# Patient Record
Sex: Female | Born: 1965 | Race: White | Hispanic: No | Marital: Married | Smoking: Former smoker
Health system: Southern US, Community
[De-identification: ages and names within clinical notes are randomized; demographics above are authoritative.]

## PROBLEM LIST (undated history)

## (undated) DIAGNOSIS — F32A Depression, unspecified: Secondary | ICD-10-CM

## (undated) DIAGNOSIS — M797 Fibromyalgia: Secondary | ICD-10-CM

## (undated) DIAGNOSIS — G629 Polyneuropathy, unspecified: Secondary | ICD-10-CM

## (undated) DIAGNOSIS — G500 Trigeminal neuralgia: Secondary | ICD-10-CM

## (undated) DIAGNOSIS — I10 Essential (primary) hypertension: Secondary | ICD-10-CM

## (undated) DIAGNOSIS — F329 Major depressive disorder, single episode, unspecified: Secondary | ICD-10-CM

## (undated) HISTORY — PX: CHOLECYSTECTOMY: SHX55

## (undated) HISTORY — PX: GANGLION CYST EXCISION: SHX1691

## (undated) HISTORY — PX: TUBAL LIGATION: SHX77

---

## 2007-09-21 ENCOUNTER — Ambulatory Visit: Payer: Self-pay | Admitting: Internal Medicine

## 2008-06-06 ENCOUNTER — Ambulatory Visit: Payer: Self-pay | Admitting: Family Medicine

## 2010-08-10 ENCOUNTER — Emergency Department: Payer: Self-pay | Admitting: Emergency Medicine

## 2011-01-19 ENCOUNTER — Ambulatory Visit: Payer: Self-pay | Admitting: Family Medicine

## 2012-01-20 ENCOUNTER — Ambulatory Visit: Payer: Self-pay | Admitting: Emergency Medicine

## 2012-03-27 ENCOUNTER — Ambulatory Visit: Payer: Self-pay | Admitting: General Practice

## 2012-05-22 ENCOUNTER — Ambulatory Visit: Payer: Self-pay | Admitting: General Practice

## 2013-05-27 ENCOUNTER — Ambulatory Visit: Payer: Self-pay | Admitting: Physician Assistant

## 2013-07-24 ENCOUNTER — Ambulatory Visit: Payer: Self-pay | Admitting: Family Medicine

## 2013-09-09 ENCOUNTER — Ambulatory Visit: Payer: Self-pay | Admitting: Physician Assistant

## 2013-09-10 ENCOUNTER — Emergency Department: Payer: Self-pay | Admitting: Emergency Medicine

## 2014-04-24 ENCOUNTER — Ambulatory Visit
Admit: 2014-04-24 | Disposition: A | Discharge status: Home / Self Care | Payer: Self-pay | Attending: Family Medicine | Admitting: Family Medicine

## 2014-06-25 ENCOUNTER — Ambulatory Visit
Admission: EM | Admit: 2014-06-25 | Discharge: 2014-06-25 | Disposition: A | Discharge status: Home / Self Care | Payer: BLUE CROSS/BLUE SHIELD | Attending: Family Medicine | Admitting: Family Medicine

## 2014-06-25 DIAGNOSIS — J029 Acute pharyngitis, unspecified: Secondary | ICD-10-CM | POA: Insufficient documentation

## 2014-06-25 HISTORY — DX: Trigeminal neuralgia: G50.0

## 2014-06-25 LAB — RAPID STREP SCREEN (MED CTR MEBANE ONLY): STREPTOCOCCUS, GROUP A SCREEN (DIRECT): NEGATIVE

## 2014-06-25 MED ORDER — FLUCONAZOLE 150 MG PO TABS: 150.0000 mg | ORAL_TABLET | Freq: Every day | ORAL | Status: DC

## 2014-06-25 MED ORDER — AMOXICILLIN 500 MG PO CAPS: 500.0000 mg | ORAL_CAPSULE | Freq: Three times a day (TID) | ORAL | Status: AC

## 2014-06-25 NOTE — ED Provider Notes (Signed)
Patient presents today with symptoms of sore throat, subjective fever, lymph node swelling, generalized body aches. Patient states that she has had the symptoms since yesterday. Patient denies any cough, rhinorrhea, headache.  Review systems negative except mentioned above. Vitals as per chart.   GENERAL: NAD HEENT: mild to moderate pharyngeal erythema, no exudate, no erythema of TMs, mild cervical LAD RESP: CTA B CARD: RRR NEURO: CN II-XII groslly intact   A/P: Pharyngitis-rapid strep test was negative throat culture sent to lab. We'll prescribe patient Amoxicillin given symptoms. Patient requests Diflucan in case of a yeast infection. Ibuprofen when necessary. Patient will call in [phone] hours to check on throat culture results. She is to seek medical attention if symptoms do worsen.   Jolene ProvostKirtida Anginette Espejo, MD 06/25/14 [phone]

## 2014-06-25 NOTE — ED Notes (Signed)
"  Symptoms began yesterday morning with sore throat, transitioned into generalized body aches in afternoon. Left sided neck pain, pain with swallowing."

## 2014-06-28 LAB — CULTURE, GROUP A STREP (THRC)

## 2014-09-30 ENCOUNTER — Other Ambulatory Visit: Payer: Self-pay | Admitting: Obstetrics and Gynecology

## 2014-09-30 DIAGNOSIS — Z1231 Encounter for screening mammogram for malignant neoplasm of breast: Secondary | ICD-10-CM

## 2015-06-09 ENCOUNTER — Encounter: Payer: Self-pay | Admitting: *Deleted

## 2015-06-09 ENCOUNTER — Ambulatory Visit
Admission: EM | Admit: 2015-06-09 | Discharge: 2015-06-09 | Disposition: A | Discharge status: Home / Self Care | Payer: BLUE CROSS/BLUE SHIELD | Attending: Family Medicine | Admitting: Family Medicine

## 2015-06-09 DIAGNOSIS — M545 Low back pain, unspecified: Secondary | ICD-10-CM

## 2015-06-09 DIAGNOSIS — R109 Unspecified abdominal pain: Secondary | ICD-10-CM

## 2015-06-09 HISTORY — DX: Major depressive disorder, single episode, unspecified: F32.9

## 2015-06-09 HISTORY — DX: Depression, unspecified: F32.A

## 2015-06-09 LAB — URINALYSIS COMPLETE WITH MICROSCOPIC (ARMC ONLY)
BACTERIA UA: NONE SEEN
Bilirubin Urine: NEGATIVE
Glucose, UA: NEGATIVE mg/dL
HGB URINE DIPSTICK: NEGATIVE
Ketones, ur: NEGATIVE mg/dL
Leukocytes, UA: NEGATIVE
Nitrite: NEGATIVE
Protein, ur: NEGATIVE mg/dL
Specific Gravity, Urine: 1.01 (ref 1.005–1.030)
WBC UA: NONE SEEN WBC/hpf (ref 0–5)
pH: 7 (ref 5.0–8.0)

## 2015-06-09 MED ORDER — CYCLOBENZAPRINE HCL 10 MG PO TABS: 10.0000 mg | ORAL_TABLET | Freq: Every day | ORAL | Status: DC

## 2015-06-09 NOTE — ED Notes (Signed)
Patient started having right flank pain one month ago that has come and gone. Last PM the right flank pain became worse. Patient has a history of gallbladder disease and cholecystectomy.

## 2015-06-09 NOTE — Discharge Instructions (Signed)

## 2015-08-05 NOTE — ED Provider Notes (Signed)
CSN: [id number]     Arrival date & time 06/09/15  0945 History   First MD Initiated Contact with Patient 06/09/15 1055     Chief Complaint  Patient presents with  . Flank Pain   (Consider location/radiation/quality/duration/timing/severity/associated sxs/prior Treatment) HPI Comments: 50 yo female with a c/o intermittent right flank pain for the past month but worse since last night. Denies any fevers, chills, dysuria, vomiting, diarrhea, melena, hematochezia.   Patient is a 50 y.o. female presenting with flank pain. The history is provided by the patient.  Flank Pain This is a new problem. The current episode started more than 1 week ago. Episode frequency: intermittent. Pertinent negatives include no chest pain and no shortness of breath.    Past Medical History  Diagnosis Date  . Trigeminal neuralgia   . Depression    Past Surgical History  Procedure Laterality Date  . Cholecystectomy    . Tubal ligation    . Ganglion cyst excision Right    History reviewed. No pertinent family history. Social History  Substance Use Topics  . Smoking status: Never Smoker   . Smokeless tobacco: None  . Alcohol Use: Yes   OB History    No data available     Review of Systems  Respiratory: Negative for shortness of breath.   Cardiovascular: Negative for chest pain.  Genitourinary: Positive for flank pain.    Allergies  Codeine sulfate  Home Medications   Prior to Admission medications   Medication Sig Start Date End Date Taking? Authorizing Provider  Norgestimate-Ethinyl Estradiol Triphasic (ORTHO TRI-CYCLEN LO) 0.18/0.215/0.25 MG-25 MCG tab Take 1 tablet by mouth daily.   Yes Historical Provider, MD  cyclobenzaprine (FLEXERIL) 10 MG tablet Take 1 tablet (10 mg total) by mouth at bedtime. 06/09/15   Payton Mccallum, MD  fluconazole (DIFLUCAN) 150 MG tablet Take 1 tablet (150 mg total) by mouth daily. 06/25/14   Jolene Provost, MD   Meds Ordered and Administered this Visit  Medications  - No data to display  BP 126/81 mmHg  Pulse 76  Temp(Src) 98.4 F (36.9 C) (Oral)  Resp 18  Ht  (1.651 m)  Wt 185 lb (83.915 kg)  BMI 30.79 kg/m2  SpO2 98%  LMP 05/26/2015 No data found.   Physical Exam  Constitutional: She appears well-developed.  Cardiovascular: Normal rate, regular rhythm, normal heart sounds and intact distal pulses.   No murmur heard. Pulmonary/Chest: Effort normal and breath sounds normal. No respiratory distress. She has no wheezes. She has no rales. She exhibits tenderness (right lower chest wall ribs).  Abdominal: Soft. Bowel sounds are normal. She exhibits no distension and no mass. There is no tenderness. There is no rebound and no guarding.  Neurological: She is alert.  Skin: Skin is warm and dry. No rash noted. She is not diaphoretic. No erythema.  Nursing note and vitals reviewed.   ED Course  Procedures (including critical care time)  Labs Review Labs Reviewed  URINALYSIS COMPLETEWITH MICROSCOPIC [facility] ONLY) - Abnormal; Notable for the following:    Squamous Epithelial / LPF 0-5 (*)    All other components within normal limits    Imaging Review No results found.   Visual Acuity Review  Right Eye Distance:   Left Eye Distance:   Bilateral Distance:    Right Eye Near:   Left Eye Near:    Bilateral Near:         MDM   1. Right-sided low back pain without sciatica  2. Right flank pain    Discharge Medication List as of 06/09/2015 11:57 AM     1. Lab results and diagnosis reviewed with patient 2. rx as per orders above; reviewed possible side effects, interactions, risks and benefits  3. Recommend supportive treatment with otc analgesics prn 4. Follow-up prn if symptoms worsen or don't improve    Payton Mccallumrlando Zykerria Tanton, MD 08/05/15 1909

## 2015-08-24 ENCOUNTER — Other Ambulatory Visit: Payer: Self-pay | Admitting: Family Medicine

## 2015-08-24 DIAGNOSIS — Z1231 Encounter for screening mammogram for malignant neoplasm of breast: Secondary | ICD-10-CM

## 2015-08-25 ENCOUNTER — Ambulatory Visit
Admission: RE | Admit: 2015-08-25 | Discharge: 2015-08-25 | Disposition: A | Discharge status: Home / Self Care | Payer: BLUE CROSS/BLUE SHIELD | Source: Ambulatory Visit | Attending: Family Medicine | Admitting: Family Medicine

## 2015-08-25 ENCOUNTER — Other Ambulatory Visit: Payer: Self-pay | Admitting: Family Medicine

## 2015-08-25 DIAGNOSIS — Z1231 Encounter for screening mammogram for malignant neoplasm of breast: Secondary | ICD-10-CM

## 2015-12-07 DIAGNOSIS — Z8669 Personal history of other diseases of the nervous system and sense organs: Secondary | ICD-10-CM | POA: Insufficient documentation

## 2016-02-15 ENCOUNTER — Ambulatory Visit
Admission: EM | Admit: 2016-02-15 | Discharge: 2016-02-15 | Disposition: A | Discharge status: Home / Self Care | Payer: BLUE CROSS/BLUE SHIELD | Attending: Family Medicine | Admitting: Family Medicine

## 2016-02-15 ENCOUNTER — Encounter: Payer: Self-pay | Admitting: Emergency Medicine

## 2016-02-15 DIAGNOSIS — J069 Acute upper respiratory infection, unspecified: Secondary | ICD-10-CM

## 2016-02-15 MED ORDER — BENZONATATE 200 MG PO CAPS: 200.0000 mg | ORAL_CAPSULE | Freq: Three times a day (TID) | ORAL | 0 refills | Status: DC

## 2016-02-15 MED ORDER — FEXOFENADINE-PSEUDOEPHED ER 60-120 MG PO TB12: 1.0000 | ORAL_TABLET | Freq: Two times a day (BID) | ORAL | 0 refills | Status: DC

## 2016-02-15 MED ORDER — PREDNISONE 20 MG PO TABS: ORAL_TABLET | ORAL | 0 refills | Status: DC

## 2016-02-15 NOTE — ED Provider Notes (Signed)
CSN: [id number]     Arrival date & time 02/15/16  1343 History   First MD Initiated Contact with Patient 02/15/16 1741     Chief Complaint  Patient presents with  . Generalized Body Aches  . Fever   (Consider location/radiation/quality/duration/timing/severity/associated sxs/prior Treatment) HPI  Is a 51 year old female who presents today for chills alternating with sweat nasal congestion sore throat and ear pain. She has had fever That has been more subjective. Over The counter medications have not benefited her. She denies any shortness of breath she's had no sick contacts did not have a flu shot this year. He also complained of wheezing at nighttime when she lays down.      Past Medical History:  Diagnosis Date  . Depression   . Trigeminal neuralgia    Past Surgical History:  Procedure Laterality Date  . CHOLECYSTECTOMY    . GANGLION CYST EXCISION Right   . TUBAL LIGATION     Family History  Problem Relation Age of Onset  . Breast cancer Maternal Aunt 59   Social History  Substance Use Topics  . Smoking status: Never Smoker  . Smokeless tobacco: Never Used  . Alcohol use Yes   OB History    No data available     Review of Systems  Constitutional: Positive for activity change, chills, fatigue and fever.  HENT: Positive for congestion, postnasal drip, sinus pain, sinus pressure and sore throat.   Respiratory: Positive for cough and wheezing. Negative for shortness of breath and stridor.   All other systems reviewed and are negative.   Allergies  Percocet [oxycodone-acetaminophen] and Codeine sulfate  Home Medications   Prior to Admission medications   Medication Sig Start Date End Date Taking? Authorizing Provider  benzonatate (TESSALON) 200 MG capsule Take 1 capsule (200 mg total) by mouth 3 (three) times daily. As necessary for coughing 02/15/16   Rachel Feil, PA-C  fexofenadine-pseudoephedrine (ALLEGRA-D) 60-120 MG 12 hr tablet Take 1 tablet by mouth  every 12 (twelve) hours. 02/15/16   Rachel Feil, PA-C  Norgestimate-Ethinyl Estradiol Triphasic (ORTHO TRI-CYCLEN LO) 0.18/0.215/0.25 MG-25 MCG tab Take 1 tablet by mouth daily.    Historical Provider, MD  predniSONE (DELTASONE) 20 MG tablet Take 2 tablets (40 mg) daily by mouth 02/15/16   Rachel Feil, PA-C   Meds Ordered and Administered this Visit  Medications - No data to display  BP 136/77 (BP Location: Left Arm)   Pulse 100   Temp 98.5 F (36.9 C) (Oral)   Resp 16   Ht 5\' 5"  (1.651 m)   Wt 190 lb (86.2 kg)   LMP 02/08/2016 (Approximate)   SpO2 99%   BMI 31.62 kg/m  No data found.   Physical Exam  Constitutional: She is oriented to person, place, and time. She appears well-developed and well-nourished. No distress.  HENT:  Head: Normocephalic and atraumatic.  Right Ear: External ear normal.  Left Ear: External ear normal.  Nose: Nose normal.  Mouth/Throat: Oropharynx is clear and moist. No oropharyngeal exudate.  The patient's turbinates are swollen erythematous  Eyes: EOM are normal. Pupils are equal, round, and reactive to light. Right eye exhibits no discharge. Left eye exhibits no discharge.  Neck: Normal range of motion. Neck supple.  Pulmonary/Chest: Effort normal and breath sounds normal. No respiratory distress. She has no wheezes. She has no rales.  Musculoskeletal: Normal range of motion.  Lymphadenopathy:    She has no cervical adenopathy.  Neurological: She is alert and  oriented to person, place, and time.  Skin: Skin is warm and dry. She is not diaphoretic.  Psychiatric: She has a normal mood and affect. Her behavior is normal. Judgment and thought content normal.  Nursing note and vitals reviewed.   Urgent Care Course     Procedures (including critical care time)  Labs Review Labs Reviewed - No data to display  Imaging Review No results found.   Visual Acuity Review  Right Eye Distance:   Left Eye Distance:   Bilateral Distance:     Right Eye Near:   Left Eye Near:    Bilateral Near:         MDM   1. Upper respiratory tract infection, unspecified type    Discharge Medication List as of 02/15/2016  6:10 PM    START taking these medications   Details  benzonatate (TESSALON) 200 MG capsule Take 1 capsule (200 mg total) by mouth 3 (three) times daily. As necessary for coughing, Starting Mon 02/15/2016, Normal    fexofenadine-pseudoephedrine (ALLEGRA-D) 60-120 MG 12 hr tablet Take 1 tablet by mouth every 12 (twelve) hours., Starting Mon 02/15/2016, Normal    predniSONE (DELTASONE) 20 MG tablet Take 2 tablets (40 mg) daily by mouth, Print      Plan: 1. Test/x-ray results and diagnosis reviewed with patient 2. rx as per orders; risks, benefits, potential side effects reviewed with patient 3. Recommend supportive treatment with Rest and fluids. Tylenol and Motrin for aches pains and fever. Continue using Nasacort for swollen nasal passages and to help promote drainage. Using prednisone for persistent coughing do not take if the coughing stops. If not Progressing follow-up with her primary care physician 4. F/u prn if symptoms worsen or don't improve     Rachel FeilWilliam P Dorella Laster, PA-C 02/15/16 1820

## 2016-02-15 NOTE — ED Triage Notes (Signed)
Patient c/o cough, runny nose, bodyaches and fever since Thursday.

## 2016-03-25 ENCOUNTER — Other Ambulatory Visit: Payer: Self-pay

## 2016-06-01 ENCOUNTER — Other Ambulatory Visit: Payer: Self-pay | Admitting: Neurology

## 2016-06-01 DIAGNOSIS — R41840 Attention and concentration deficit: Secondary | ICD-10-CM

## 2016-06-01 DIAGNOSIS — R569 Unspecified convulsions: Secondary | ICD-10-CM

## 2016-06-21 ENCOUNTER — Ambulatory Visit: Payer: BLUE CROSS/BLUE SHIELD

## 2016-07-07 ENCOUNTER — Emergency Department: Payer: BLUE CROSS/BLUE SHIELD

## 2016-07-07 ENCOUNTER — Emergency Department
Admission: EM | Admit: 2016-07-07 | Discharge: 2016-07-07 | Disposition: A | Discharge status: Home / Self Care | Payer: BLUE CROSS/BLUE SHIELD | Attending: Emergency Medicine | Admitting: Emergency Medicine

## 2016-07-07 DIAGNOSIS — R519 Headache, unspecified: Secondary | ICD-10-CM

## 2016-07-07 DIAGNOSIS — Z8669 Personal history of other diseases of the nervous system and sense organs: Secondary | ICD-10-CM | POA: Diagnosis not present

## 2016-07-07 DIAGNOSIS — R200 Anesthesia of skin: Secondary | ICD-10-CM | POA: Diagnosis not present

## 2016-07-07 DIAGNOSIS — I639 Cerebral infarction, unspecified: Secondary | ICD-10-CM | POA: Insufficient documentation

## 2016-07-07 DIAGNOSIS — Z79899 Other long term (current) drug therapy: Secondary | ICD-10-CM | POA: Insufficient documentation

## 2016-07-07 DIAGNOSIS — R51 Headache: Secondary | ICD-10-CM | POA: Diagnosis not present

## 2016-07-07 LAB — DIFFERENTIAL
BASOS ABS: 0 10*3/uL (ref 0–0.1)
Basophils Relative: 1 %
EOS ABS: 0 10*3/uL (ref 0–0.7)
EOS PCT: 1 %
LYMPHS ABS: 1.8 10*3/uL (ref 1.0–3.6)
Lymphocytes Relative: 31 %
MONOS PCT: 5 %
Monocytes Absolute: 0.3 10*3/uL (ref 0.2–0.9)
Neutro Abs: 3.8 10*3/uL (ref 1.4–6.5)
Neutrophils Relative %: 64 %

## 2016-07-07 LAB — COMPREHENSIVE METABOLIC PANEL
ALT: 26 U/L (ref 14–54)
AST: 27 U/L (ref 15–41)
Albumin: 3.9 g/dL (ref 3.5–5.0)
Alkaline Phosphatase: 49 U/L (ref 38–126)
Anion gap: 6 (ref 5–15)
BILIRUBIN TOTAL: 0.7 mg/dL (ref 0.3–1.2)
BUN: 13 mg/dL (ref 6–20)
CO2: 25 mmol/L (ref 22–32)
Calcium: 9.2 mg/dL (ref 8.9–10.3)
Chloride: 106 mmol/L (ref 101–111)
Creatinine, Ser: 0.75 mg/dL (ref 0.44–1.00)
GFR calc Af Amer: >60 mL/min (ref >60)
GFR calc non Af Amer: >60 mL/min (ref >60)
Glucose, Bld: 90 mg/dL (ref 65–99)
POTASSIUM: 4.1 mmol/L (ref 3.5–5.1)
Sodium: 137 mmol/L (ref 135–145)
Total Protein: 7.5 g/dL (ref 6.5–8.1)

## 2016-07-07 LAB — CBC
HEMATOCRIT: 36.9 % (ref 35.0–47.0)
HEMOGLOBIN: 12.4 g/dL (ref 12.0–16.0)
MCH: 29.1 pg (ref 26.0–34.0)
MCHC: 33.6 g/dL (ref 32.0–36.0)
MCV: 86.5 fL (ref 80.0–100.0)
Platelets: 253 10*3/uL (ref 150–440)
RBC: 4.27 MIL/uL (ref 3.80–5.20)
RDW: 13.2 % (ref 11.5–14.5)
WBC: 5.9 10*3/uL (ref 3.6–11.0)

## 2016-07-07 LAB — PROTIME-INR
INR: 1.07
Prothrombin Time: 13.9 seconds (ref 11.4–15.2)

## 2016-07-07 LAB — GLUCOSE, CAPILLARY: Glucose-Capillary: 94 mg/dL (ref 65–99)

## 2016-07-07 LAB — TROPONIN I

## 2016-07-07 LAB — APTT: aPTT: 26 seconds (ref 24–36)

## 2016-07-07 MED ORDER — KETOROLAC TROMETHAMINE 30 MG/ML IJ SOLN
30.0000 mg | Freq: Once | INTRAMUSCULAR | Status: AC
  Administered 2016-07-07: 30 mg via INTRAVENOUS
  Filled 2016-07-07: qty 1

## 2016-07-07 MED ORDER — PROCHLORPERAZINE EDISYLATE 5 MG/ML IJ SOLN
10.0000 mg | Freq: Once | INTRAMUSCULAR | Status: AC
  Administered 2016-07-07: 10 mg via INTRAVENOUS
  Filled 2016-07-07: qty 2

## 2016-07-07 MED ORDER — SODIUM CHLORIDE 0.9 % IV BOLUS (SEPSIS)
1000.0000 mL | Freq: Once | INTRAVENOUS | Status: AC
  Administered 2016-07-07: 1000 mL via INTRAVENOUS

## 2016-07-07 MED ORDER — DIPHENHYDRAMINE HCL 50 MG/ML IJ SOLN
25.0000 mg | Freq: Once | INTRAMUSCULAR | Status: AC
  Administered 2016-07-07: 25 mg via INTRAVENOUS
  Filled 2016-07-07: qty 1

## 2016-07-07 NOTE — ED Notes (Signed)
SOC  REPORT  GIVEN  TO DR  Pershing ProudSCHAEVITZ

## 2016-07-07 NOTE — ED Notes (Signed)
SOC    CALLED 

## 2016-07-07 NOTE — ED Notes (Signed)
cbg 94 

## 2016-07-07 NOTE — ED Notes (Signed)
SOC on at bedside, neurologist assessing pt at this time

## 2016-07-07 NOTE — ED Provider Notes (Signed)
[facility] Emergency Department Provider Note  ____________________________________________   First MD Initiated Contact with Patient 07/07/16 1527     (approximate)  I have reviewed the triage vital signs and the nursing notes.   HISTORY  Chief Complaint Code Stroke and Facial numbness   HPI Rachel Pratt is a 51 y.o. female with a history of trigeminal neuralgia as well as depression was presenting to the emergency department with right-sided facial numbness over the past hour. She says that she also thought that she noticed a right-sided facial droop especially around her right lateral eye. Patient says that she now is a 4-5 out of 10 headache. Said that her headache was an 8 out of 10 this morning also a 5 out of 10 before going to sleep last night. Says the headache is diffuse. Denies any vision change. Denies any nausea or vomiting. Denies any history of migraine accompanied a migraine. History of cardiac disease in her family and TIAs versus seizures and her father. Patient is not on any chronic medications. Does not smoke cigarettes or drink alcohol. Says that she uses marijuana occasionally. Says that she has also had some numbness to the right side of her chest and her right arm she is not experiencing either of these symptoms at this time.   Past Medical History:  Diagnosis Date  . Depression   . Trigeminal neuralgia     There are no active problems to display for this patient.   Past Surgical History:  Procedure Laterality Date  . CHOLECYSTECTOMY    . GANGLION CYST EXCISION Right   . TUBAL LIGATION      Prior to Admission medications   Medication Sig Start Date End Date Taking? Authorizing Provider  modafinil (PROVIGIL) 100 MG tablet Take 100 mg by mouth every morning. 07/02/16  Yes [provider]  TRI-PREVIFEM 0.18/0.215/0.25 MG-35 MCG tablet Take 1 tablet by mouth daily. 05/22/16  Yes [provider]  benzonatate  (TESSALON) 200 MG capsule Take 1 capsule (200 mg total) by mouth 3 (three) times daily. As necessary for coughing Patient not taking: Reported on 07/07/2016 02/15/16   Ovid Curd P, PA-C  fexofenadine-pseudoephedrine (ALLEGRA-D) 60-120 MG 12 hr tablet Take 1 tablet by mouth every 12 (twelve) hours. Patient not taking: Reported on 07/07/2016 02/15/16   Lutricia Feil, PA-C  predniSONE (DELTASONE) 20 MG tablet Take 2 tablets (40 mg) daily by mouth Patient not taking: Reported on 07/07/2016 02/15/16   Lutricia Feil, PA-C    Allergies Percocet [oxycodone-acetaminophen] and Codeine sulfate  Family History  Problem Relation Age of Onset  . Breast cancer Maternal Aunt 59    Social History Social History  Substance Use Topics  . Smoking status: Never Smoker  . Smokeless tobacco: Never Used  . Alcohol use Yes    Review of Systems  Constitutional: No fever/chills Eyes: No visual changes. ENT: No sore throat. Cardiovascular: Denies chest pain. Respiratory: Denies shortness of breath. Gastrointestinal: No abdominal pain.  No nausea, no vomiting.  No diarrhea.  No constipation. Genitourinary: Negative for dysuria. Musculoskeletal: Negative for back pain. Skin: Negative for rash. Neurological: Negative for focal weakness    ____________________________________________   PHYSICAL EXAM:  VITAL SIGNS: ED Triage Vitals  Enc Vitals Group     BP 07/07/16 1519 (!) 160/100     Pulse Rate 07/07/16 1519 77     Resp 07/07/16 1519 18     Temp 07/07/16 1519 98.5 F (36.9 C)  Temp Source 07/07/16 1519 Oral     SpO2 07/07/16 1519 97 %     Weight 07/07/16 1519 190 lb (86.2 kg)     Height 07/07/16 1519 5\' 5"  (1.651 m)     Head Circumference --      Peak Flow --      Pain Score 07/07/16 1537 4     Pain Loc --      Pain Edu? --      Excl. in GC? --     Constitutional: Alert and oriented. Well appearing and in no acute distress. Eyes: Conjunctivae are normal.  Head:  Atraumatic. Nose: No congestion/rhinnorhea. Mouth/Throat: Mucous membranes are moist.  Neck: No stridor.   Cardiovascular: Normal rate, regular rhythm. Grossly normal heart sounds.  Respiratory: Normal respiratory effort.  No retractions. Lungs CTAB. Gastrointestinal: Soft and nontender. No distention. No CVA tenderness. Musculoskeletal: No lower extremity tenderness nor edema.  No joint effusions. Neurologic:  Normal speech and language. 5 out of 5 strength throughout without any obvious facial droop. Very slight decrease in sensation to light touch to the right side of the face. Skin:  Skin is warm, dry and intact. No rash noted. Psychiatric: Mood and affect are normal. Speech and behavior are normal.  NIH Stroke Scale  Person Administering Scale: Arelia Longest  Administer stroke scale items in the order listed. Record performance in each category after each subscale exam. Do not go back and change scores. Follow directions provided for each exam technique. Scores should reflect what the patient does, not what the clinician thinks the patient can do. The clinician should record answers while administering the exam and work quickly. Except where indicated, the patient should not be coached (i.e., repeated requests to patient to make a special effort).   1a  Level of consciousness: 0=alert; keenly responsive  1b. LOC questions:  0=Performs both tasks correctly  1c. LOC commands: 0=Performs both tasks correctly  2.  Best Gaze: 0=normal  3.  Visual: 0=No visual loss  4. Facial Palsy: 0=Normal symmetric movement  5a.  Motor left arm: 0=No drift, limb holds 90 (or 45) degrees for full 10 seconds  5b.  Motor right arm: 0=No drift, limb holds 90 (or 45) degrees for full 10 seconds  6a. motor left leg: 0=No drift, limb holds 90 (or 45) degrees for full 10 seconds  6b  Motor right leg:  0=No drift, limb holds 90 (or 45) degrees for full 10 seconds  7. Limb Ataxia: 0=Absent  8.  Sensory:  1=Mild to moderate sensory loss; patient feels pinprick is less sharp or is dull on the affected side; there is a loss of superficial pain with pinprick but patient is aware She is being touched  9. Best Language:  0=No aphasia, normal  10. Dysarthria: 0=Normal  11. Extinction and Inattention: 0=No abnormality  12. Distal motor function: 0=Normal   Total:   1   ____________________________________________   LABS (all labs ordered are listed, but only abnormal results are displayed)  Labs Reviewed  PROTIME-INR  APTT  CBC  DIFFERENTIAL  COMPREHENSIVE METABOLIC PANEL  TROPONIN I  GLUCOSE, CAPILLARY  CBG MONITORING, ED   ____________________________________________  EKG  ED ECG REPORT I, Schaevitz,  Teena Irani, the attending physician, personally viewed and interpreted this ECG.   Date: 07/07/2016  EKG Time: 1520  Rate: 77  Rhythm: normal sinus rhythm  Axis: normal  Intervals:none  ST&T Change: No ST segment elevation or depression. No abnormal T-wave inversion.  ____________________________________________  RADIOLOGY  No acute finding on the CT of the head.  MRI of the brain without any acute finding. ____________________________________________   PROCEDURES  Procedure(s) performed:   Procedures  Critical Care performed:   ____________________________________________   INITIAL IMPRESSION / ASSESSMENT AND PLAN / ED COURSE  Pertinent labs & imaging results that were available during my care of the patient were reviewed by me and considered in my medical decision making (see chart for details).  ----------------------------------------- 4:47 PM on 07/07/2016 -----------------------------------------  Patient initially made a stroke alert and was evaluating by the specialist on-call neurologist, Dr.Mrelashvili.  The neurologist does not recommend TPA at this time. Stroke like symptoms versus, migraine. We will treat with a "migraine cocktail" and reassess.  Patient also to receive an MRI.    ----------------------------------------- 6:02 PM on 07/07/2016 -----------------------------------------  Patient with very reassuring workup. Symptoms have completely resolved at this time. More likely, located migraine. Husband now bedside and had a concern about a possible melanoma in the patient's right iris. A 1 mm, oval shaped brown spot is noted. Patient says that she has a follow-up appointment within ophthalmologic oncologist. Very unlikely that this lesion would be the cause of her symptoms today. Patient will follow up with her primary care doctor as well as neurology regarding her current visit. We reviewed the imaging as well as lab results. She is understanding of the diagnosis will plan and is willing to comply.  ____________________________________________   FINAL CLINICAL IMPRESSION(S) / ED DIAGNOSES  Headache. Numbness.    NEW MEDICATIONS STARTED DURING THIS VISIT:  New Prescriptions   No medications on file     Note:  This document was prepared using Dragon voice recognition software and may include unintentional dictation errors.     Myrna BlazerSchaevitz, David Matthew, MD 07/07/16 [phone]

## 2016-07-07 NOTE — ED Notes (Signed)
CODE STROKE CALLED TO [phone] 

## 2016-07-07 NOTE — ED Triage Notes (Signed)
Pt reports one hour ago the right side of her face felt numb and she felt like the bone in her face was going sideways. Pt reports feeling like her right eye looks drooped and feels a tugging sensation. Bilateral strong hand grips, facial symmetry and no arm drift noted in triage. Spoke with Dr. Pershing ProudSchaevitz, code stroke orders received.

## 2016-07-07 NOTE — ED Notes (Signed)
Pt states aprox 1hr prior to arrival pt had some numbness to RT side of face and " behind her eye". Pt states went to bed with headache and continues, rates pain 4 at this time.

## 2016-07-07 NOTE — ED Notes (Signed)
ED Provider at bedside. 

## 2016-07-07 NOTE — ED Notes (Signed)
Patient transported to MRI 

## 2016-07-07 NOTE — ED Notes (Signed)
Pt denies any numbness or tingling to RT side eye, states all symptoms have resolved at this time, MD made aware

## 2016-07-08 ENCOUNTER — Ambulatory Visit: Payer: Self-pay | Admitting: Psychiatry

## 2016-07-13 ENCOUNTER — Ambulatory Visit (INDEPENDENT_AMBULATORY_CARE_PROVIDER_SITE_OTHER): Payer: BLUE CROSS/BLUE SHIELD | Admitting: Psychiatry

## 2016-07-13 DIAGNOSIS — F900 Attention-deficit hyperactivity disorder, predominantly inattentive type: Secondary | ICD-10-CM

## 2016-07-13 DIAGNOSIS — F330 Major depressive disorder, recurrent, mild: Secondary | ICD-10-CM

## 2016-07-13 MED ORDER — AMPHETAMINE-DEXTROAMPHETAMINE 5 MG PO TABS: ORAL_TABLET | ORAL | 0 refills | Status: DC
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
## 2016-08-11 ENCOUNTER — Ambulatory Visit: Payer: BLUE CROSS/BLUE SHIELD | Admitting: Psychiatry

## 2016-12-14 DIAGNOSIS — E66811 Obesity, class 1: Secondary | ICD-10-CM | POA: Insufficient documentation

## 2017-02-01 ENCOUNTER — Other Ambulatory Visit: Payer: Self-pay | Admitting: Family Medicine

## 2017-02-01 DIAGNOSIS — Z1231 Encounter for screening mammogram for malignant neoplasm of breast: Secondary | ICD-10-CM

## 2017-02-02 ENCOUNTER — Encounter (INDEPENDENT_AMBULATORY_CARE_PROVIDER_SITE_OTHER): Payer: Self-pay

## 2017-02-02 ENCOUNTER — Ambulatory Visit
Admission: RE | Admit: 2017-02-02 | Discharge: 2017-02-02 | Disposition: A | Discharge status: Home / Self Care | Payer: 59 | Source: Ambulatory Visit | Attending: Family Medicine | Admitting: Family Medicine

## 2017-02-02 DIAGNOSIS — R928 Other abnormal and inconclusive findings on diagnostic imaging of breast: Secondary | ICD-10-CM | POA: Diagnosis not present

## 2017-02-02 DIAGNOSIS — Z1231 Encounter for screening mammogram for malignant neoplasm of breast: Secondary | ICD-10-CM | POA: Diagnosis present

## 2017-02-02 DIAGNOSIS — N6310 Unspecified lump in the right breast, unspecified quadrant: Secondary | ICD-10-CM | POA: Insufficient documentation

## 2017-02-06 ENCOUNTER — Other Ambulatory Visit: Payer: Self-pay | Admitting: Family Medicine

## 2017-02-06 DIAGNOSIS — N6310 Unspecified lump in the right breast, unspecified quadrant: Secondary | ICD-10-CM

## 2017-02-06 DIAGNOSIS — R928 Other abnormal and inconclusive findings on diagnostic imaging of breast: Secondary | ICD-10-CM

## 2017-02-08 ENCOUNTER — Ambulatory Visit
Admission: RE | Admit: 2017-02-08 | Discharge: 2017-02-08 | Disposition: A | Discharge status: Home / Self Care | Payer: 59 | Source: Ambulatory Visit | Attending: Family Medicine | Admitting: Family Medicine

## 2017-02-08 DIAGNOSIS — N6001 Solitary cyst of right breast: Secondary | ICD-10-CM | POA: Diagnosis not present

## 2017-02-08 DIAGNOSIS — R928 Other abnormal and inconclusive findings on diagnostic imaging of breast: Secondary | ICD-10-CM

## 2017-02-08 DIAGNOSIS — N6310 Unspecified lump in the right breast, unspecified quadrant: Secondary | ICD-10-CM

## 2017-02-09 ENCOUNTER — Other Ambulatory Visit: Payer: Self-pay | Admitting: Family Medicine

## 2017-02-09 DIAGNOSIS — N6001 Solitary cyst of right breast: Secondary | ICD-10-CM

## 2017-08-10 ENCOUNTER — Ambulatory Visit
Admission: RE | Admit: 2017-08-10 | Discharge: 2017-08-10 | Disposition: A | Discharge status: Home / Self Care | Payer: 59 | Source: Ambulatory Visit | Attending: Family Medicine | Admitting: Family Medicine

## 2017-08-10 DIAGNOSIS — N6001 Solitary cyst of right breast: Secondary | ICD-10-CM | POA: Diagnosis present

## 2018-06-19 IMAGING — MG MM DIGITAL SCREENING BILAT W/ TOMO W/ CAD
8 of 13 series · 8 of 29 positions shown · non-contrast
Comparison: Previous exam(s).

CLINICAL DATA: Screening.

EXAM:
2D DIGITAL SCREENING BILATERAL MAMMOGRAM WITH CAD AND ADJUNCT TOMO

[L MLO (1 of 2)]
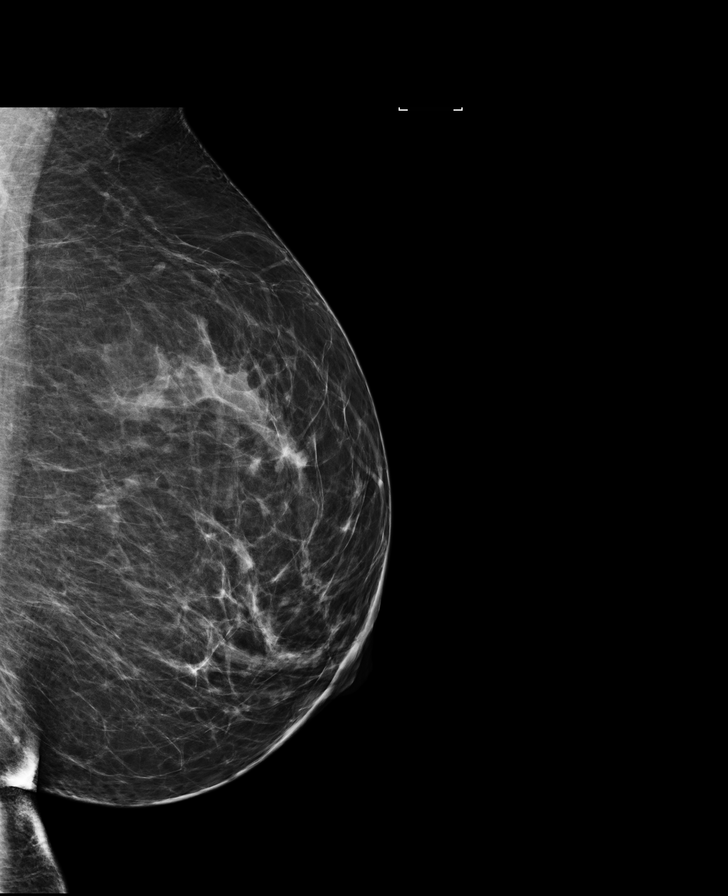

[L MLO (2 of 2)]
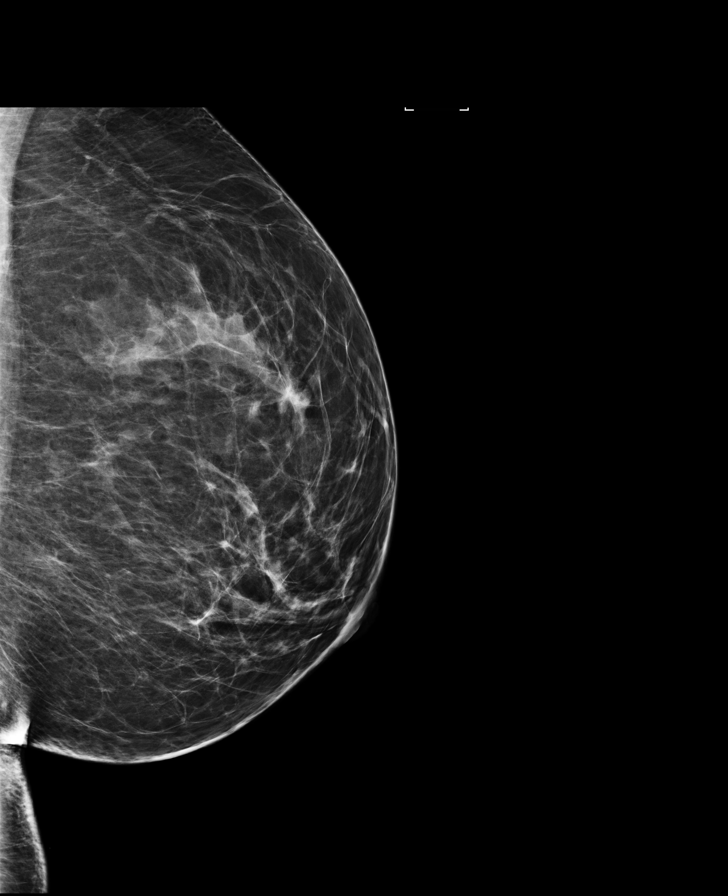

[R CC]
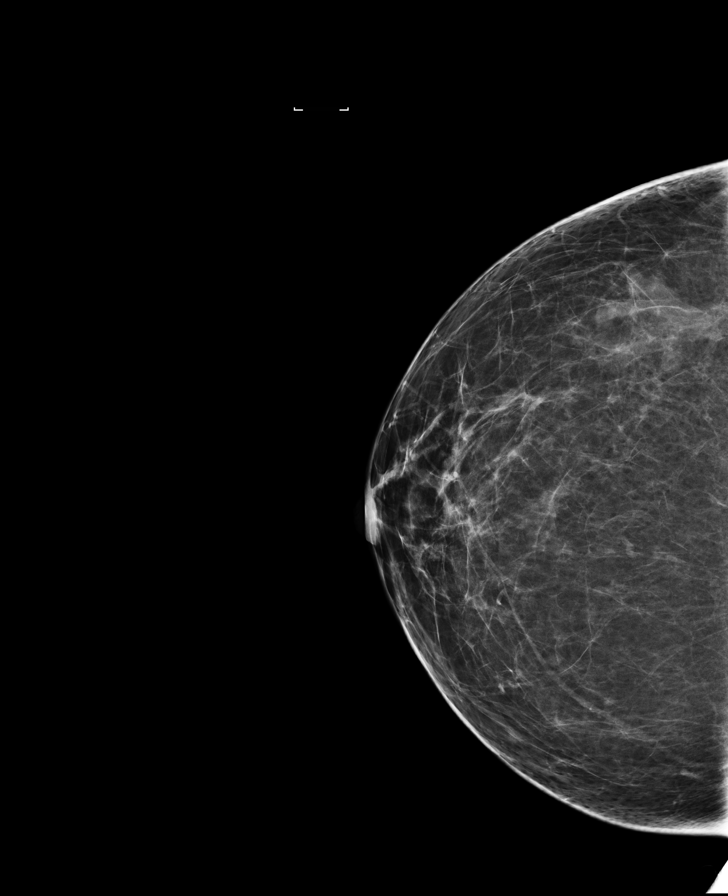

[L CC synth-2D]
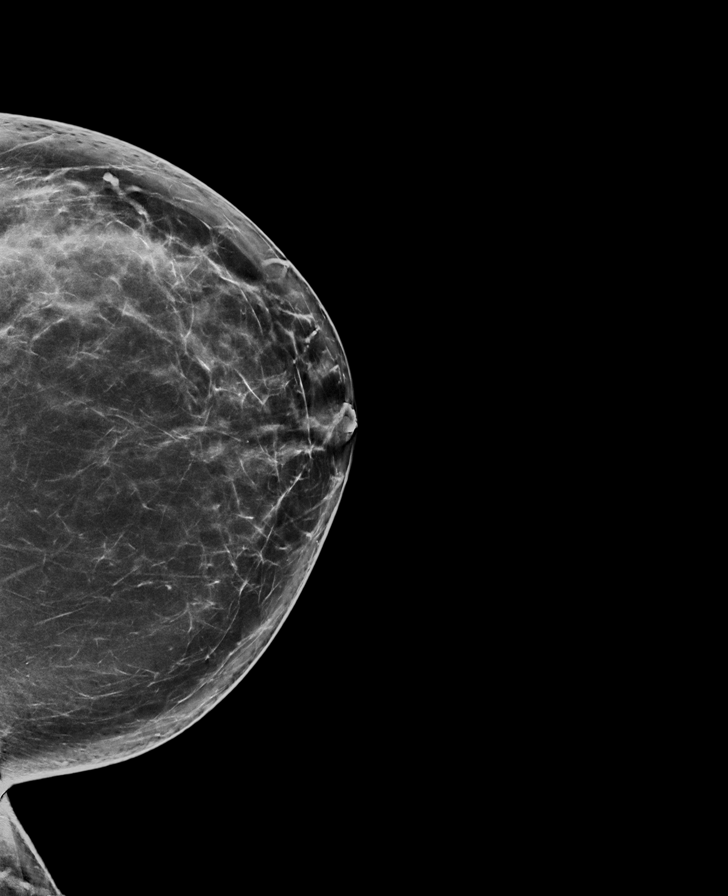

[L CC]
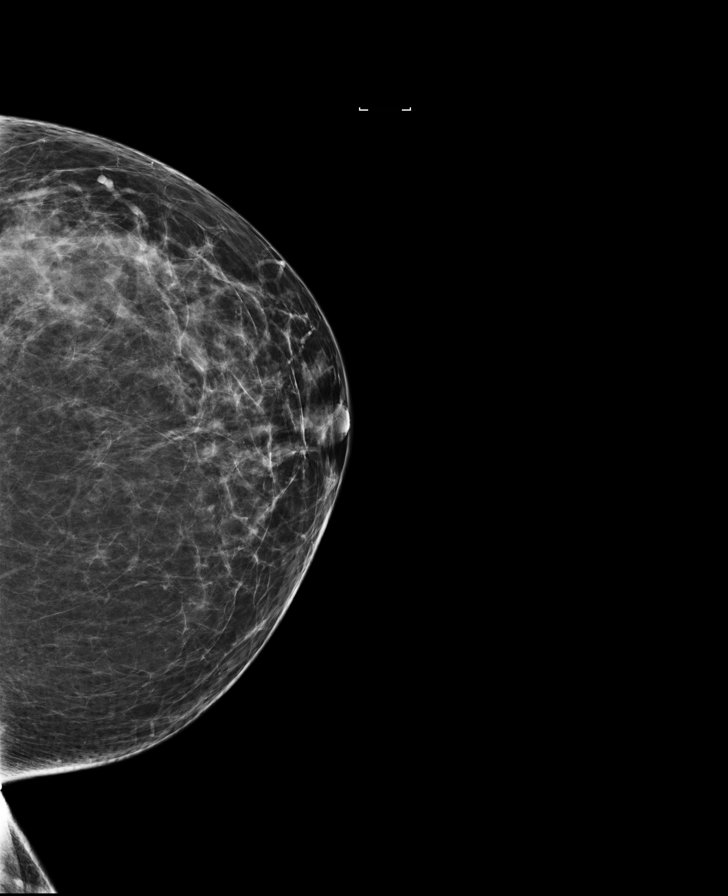

[R MLO synth-2D]
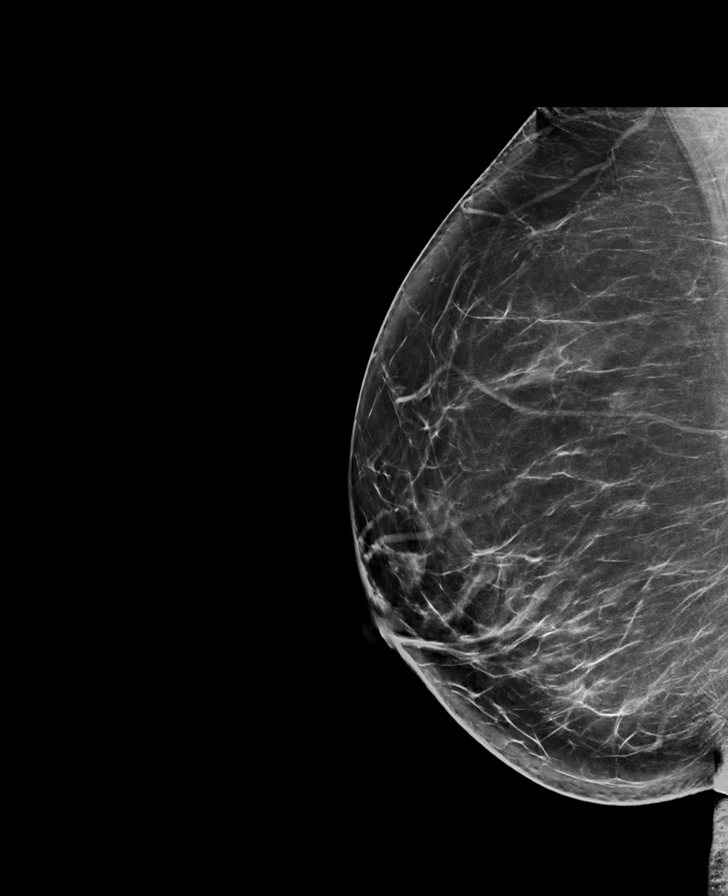

[L MLO synth-2D]
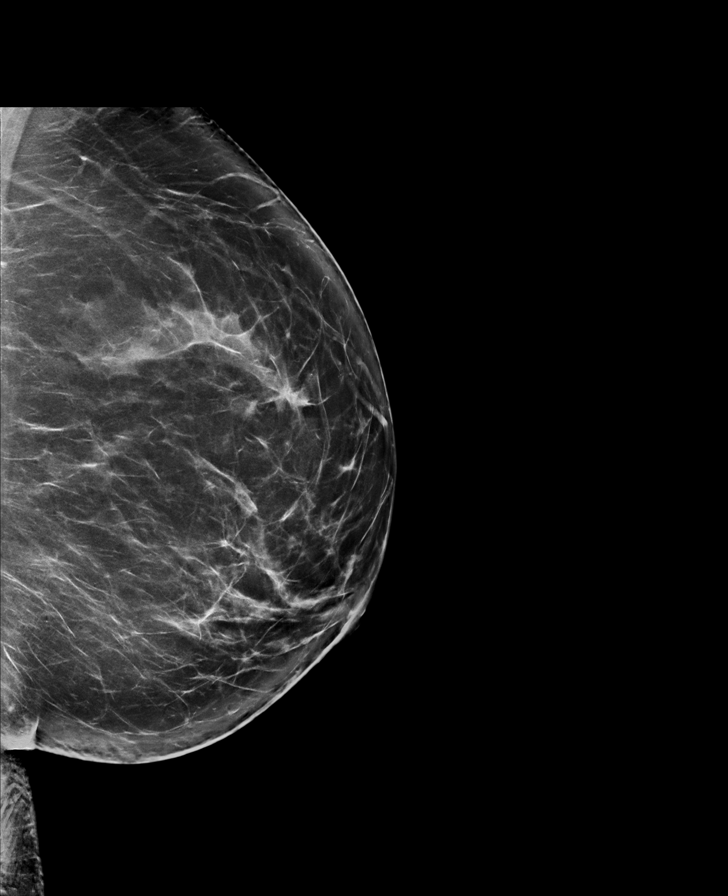

[R MLO]
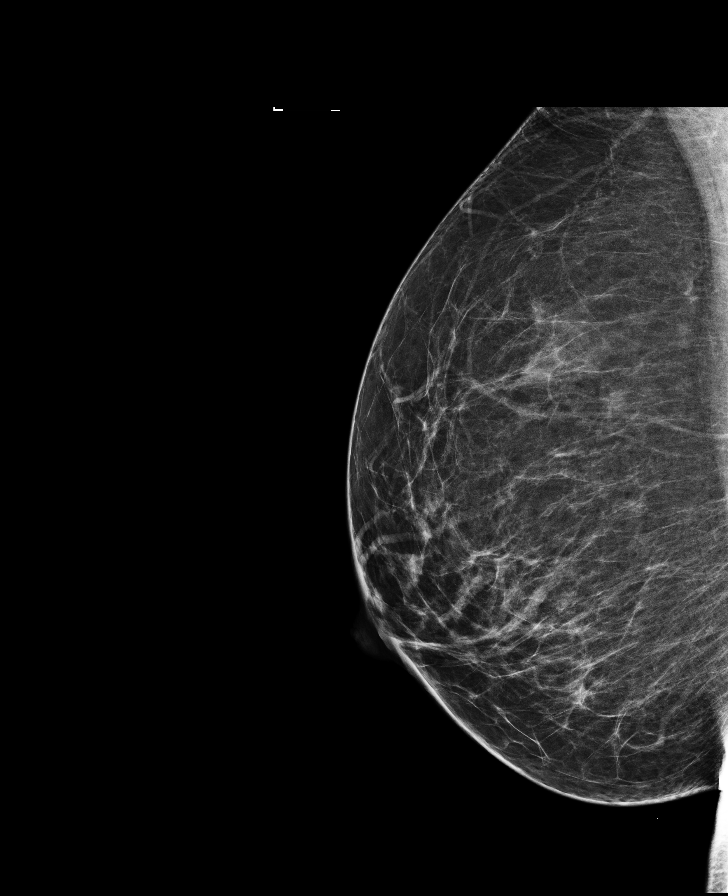

[8 of 29 positions shown; findings below may reference images not displayed]

ACR Breast Density Category b: There are scattered areas of
fibroglandular density.
FINDINGS: There are no findings suspicious for malignancy. Images were
processed with CAD.
IMPRESSION: No mammographic evidence of malignancy. A result letter of this
screening mammogram will be mailed directly to the patient.

RECOMMENDATION:
Screening mammogram in one year. (Code:97-6-RS4)

BI-RADS CATEGORY  1: Negative.

## 2018-09-26 ENCOUNTER — Other Ambulatory Visit: Payer: Self-pay

## 2018-09-26 DIAGNOSIS — Z20822 Contact with and (suspected) exposure to covid-19: Secondary | ICD-10-CM

## 2018-09-27 LAB — NOVEL CORONAVIRUS, NAA: SARS-CoV-2, NAA: NOT DETECTED

## 2018-09-28 ENCOUNTER — Encounter: Payer: Self-pay | Admitting: Nurse Practitioner

## 2018-09-28 ENCOUNTER — Telehealth: Payer: 59 | Admitting: Nurse Practitioner

## 2018-09-28 DIAGNOSIS — J069 Acute upper respiratory infection, unspecified: Secondary | ICD-10-CM

## 2018-09-28 MED ORDER — AZITHROMYCIN 250 MG PO TABS
ORAL_TABLET | ORAL | 0 refills | Status: AC
Start: 2018-09-28 — End: 2018-10-03

## 2018-09-28 MED ORDER — FLUTICASONE PROPIONATE 50 MCG/ACT NA SUSP: 2.0000 | Freq: Every day | NASAL | 0 refills | Status: DC

## 2018-09-28 NOTE — Progress Notes (Signed)
We are sorry you are not feeling well.  Here is how we plan to help!  Based on what you have shared with me, it looks like you may have a viral upper respiratory infection.  Upper respiratory infections are caused by a large number of viruses; however, rhinovirus is the most common cause.   Symptoms vary from person to person, with common symptoms including sore throat, cough, fatigue or lack of energy and feeling of general discomfort.  A low-grade fever of up to 100.4 may present, but is often uncommon.  Symptoms vary however, and are closely related to a person's age or underlying illnesses.  The most common symptoms associated with an upper respiratory infection are nasal discharge or congestion, cough, sneezing, headache and pressure in the ears and face.  These symptoms usually persist for about 3 to 10 days, but can last up to 2 weeks.  It is important to know that upper respiratory infections do not cause serious illness or complications in most cases.    Upper respiratory infections can be transmitted from person to person, with the most common method of transmission being a person's hands.  The virus is able to live on the skin and can infect other persons for up to 2 hours after direct contact.  Also, these can be transmitted when someone coughs or sneezes; thus, it is important to cover the mouth to reduce this risk.  To keep the spread of the illness at [place], good hand hygiene is very important.  This is an infection that is most likely caused by a virus.Because you have had a persistent fever over the past several days, I am going to prescribe a z-pak.  You will take 2 tablets today, then one tablet for the next 4 days.  We are sorry you are not feeling well.  Here is how we plan to help!   For nasal congestion, you may use an oral decongestants such as Mucinex D or if you have glaucoma or high blood pressure use plain Mucinex.  Saline nasal spray or nasal drops can help and can safely be  used as often as needed for congestion.  For your congestion, I have prescribed Fluticasone nasal spray one spray in each nostril twice a day.  If you do not have a history of heart disease, hypertension, diabetes or thyroid disease, prostate/bladder issues or glaucoma, you may also use Sudafed to treat nasal congestion.  It is highly recommended that you consult with a pharmacist or your primary care physician to ensure this medication is safe for you to take.     If you have a cough, you may use cough suppressants such as Delsym and Robitussin.  If you have glaucoma or high blood pressure, you can also use Coricidin HBP.   If you have a sore or scratchy throat, use a saltwater gargle-  to  teaspoon of salt dissolved in a 4-ounce to 8-ounce glass of warm water.  Gargle the solution for approximately 15-30 seconds and then spit.  It is important not to swallow the solution.  You can also use throat lozenges/cough drops and Chloraseptic spray to help with throat pain or discomfort.  Warm or cold liquids can also be helpful in relieving throat pain.  For headache, pain or general discomfort, you can use Ibuprofen or Tylenol as directed.   Some authorities believe that zinc sprays or the use of Echinacea may shorten the course of your symptoms.   HOME CARE . Only take  medications as instructed by your medical team. . Be sure to drink plenty of fluids. Water is fine as well as fruit juices, sodas and electrolyte beverages. You may want to stay away from caffeine or alcohol. If you are nauseated, try taking small sips of liquids. How do you know if you are getting enough fluid? Your urine should be a pale yellow or almost colorless. . Get rest. . Taking a steamy shower or using a humidifier may help nasal congestion and ease sore throat pain. You can place a towel over your head and breathe in the steam from hot water coming from a faucet. . Using a saline nasal spray works much the same way. . Cough  drops, hard candies and sore throat lozenges may ease your cough. . Avoid close contacts especially the very young and the elderly . Cover your mouth if you cough or sneeze . Always remember to wash your hands.   GET HELP RIGHT AWAY IF: . You develop worsening fever. . If your symptoms do not improve within 10 days . You develop yellow or green discharge from your nose over 3 days. . You have coughing fits . You develop a severe head ache or visual changes. . You develop shortness of breath, difficulty breathing or start having chest pain . Your symptoms persist after you have completed your treatment plan  MAKE SURE YOU   Understand these instructions.  Will watch your condition.  Will get help right away if you are not doing well or get worse.  Your e-visit answers were reviewed by a board certified advanced clinical practitioner to complete your personal care plan. Depending upon the condition, your plan could have included both over the counter or prescription medications. Please review your pharmacy choice. If there is a problem, you may call our nursing hot line at and have the prescription routed to another pharmacy. Your safety is important to us. If you have drug allergies check your prescription carefully.   You can use MyChart to ask questions about today's visit, request a non-urgent call back, or ask for a work or school excuse for 24 hours related to this e-Visit. If it has been greater than 24 hours you will need to follow up with your provider, or enter a new e-Visit to address those concerns. You will get an e-mail in the next two days asking about your experience.  I hope that your e-visit has been valuable and will speed your recovery. Thank you for using e-visits.  Approximately 5 minutes were spent reviewing and documenting in the patient's chart.

## 2018-12-10 ENCOUNTER — Other Ambulatory Visit: Payer: Self-pay

## 2018-12-10 DIAGNOSIS — Z20822 Contact with and (suspected) exposure to covid-19: Secondary | ICD-10-CM

## 2018-12-12 LAB — NOVEL CORONAVIRUS, NAA: SARS-CoV-2, NAA: NOT DETECTED

## 2019-01-02 ENCOUNTER — Other Ambulatory Visit: Payer: Self-pay | Admitting: Obstetrics and Gynecology

## 2019-01-02 DIAGNOSIS — Z1231 Encounter for screening mammogram for malignant neoplasm of breast: Secondary | ICD-10-CM

## 2019-01-14 ENCOUNTER — Inpatient Hospital Stay: Admission: RE | Admit: 2019-01-14 | Payer: 59 | Source: Ambulatory Visit

## 2019-02-11 ENCOUNTER — Ambulatory Visit: Payer: No Typology Code available for payment source | Attending: Internal Medicine

## 2019-02-11 DIAGNOSIS — Z20822 Contact with and (suspected) exposure to covid-19: Secondary | ICD-10-CM

## 2019-02-12 LAB — NOVEL CORONAVIRUS, NAA: SARS-CoV-2, NAA: NOT DETECTED

## 2019-03-18 ENCOUNTER — Other Ambulatory Visit: Payer: Self-pay

## 2019-03-18 ENCOUNTER — Emergency Department: Payer: 59

## 2019-03-18 ENCOUNTER — Emergency Department
Admission: EM | Admit: 2019-03-18 | Discharge: 2019-03-18 | Disposition: A | Discharge status: Home / Self Care | Payer: 59 | Attending: Student | Admitting: Student

## 2019-03-18 DIAGNOSIS — M25512 Pain in left shoulder: Secondary | ICD-10-CM | POA: Insufficient documentation

## 2019-03-18 DIAGNOSIS — R202 Paresthesia of skin: Secondary | ICD-10-CM | POA: Insufficient documentation

## 2019-03-18 DIAGNOSIS — M542 Cervicalgia: Secondary | ICD-10-CM | POA: Diagnosis not present

## 2019-03-18 DIAGNOSIS — G500 Trigeminal neuralgia: Secondary | ICD-10-CM | POA: Insufficient documentation

## 2019-03-18 DIAGNOSIS — R110 Nausea: Secondary | ICD-10-CM | POA: Diagnosis not present

## 2019-03-18 DIAGNOSIS — I10 Essential (primary) hypertension: Secondary | ICD-10-CM | POA: Diagnosis not present

## 2019-03-18 HISTORY — DX: Essential (primary) hypertension: I10

## 2019-03-18 LAB — BASIC METABOLIC PANEL
Anion gap: 7 (ref 5–15)
BUN: 8 mg/dL (ref 6–20)
CO2: 27 mmol/L (ref 22–32)
Calcium: 9.2 mg/dL (ref 8.9–10.3)
Chloride: 104 mmol/L (ref 98–111)
Creatinine, Ser: 0.64 mg/dL (ref 0.44–1.00)
GFR calc Af Amer: >60 mL/min (ref >60)
GFR calc non Af Amer: >60 mL/min (ref >60)
Glucose, Bld: 91 mg/dL (ref 70–99)
Potassium: 4.1 mmol/L (ref 3.5–5.1)
Sodium: 138 mmol/L (ref 135–145)

## 2019-03-18 LAB — CBC
HCT: 37.4 % (ref 36.0–46.0)
Hemoglobin: 12.3 g/dL (ref 12.0–15.0)
MCH: 29.7 pg (ref 26.0–34.0)
MCHC: 32.9 g/dL (ref 30.0–36.0)
MCV: 90.3 fL (ref 80.0–100.0)
Platelets: 244 10*3/uL (ref 150–400)
RBC: 4.14 MIL/uL (ref 3.87–5.11)
RDW: 12.3 % (ref 11.5–15.5)
WBC: 6.1 10*3/uL (ref 4.0–10.5)
nRBC: 0 % (ref 0.0–0.2)

## 2019-03-18 LAB — HEPATIC FUNCTION PANEL
ALT: 19 U/L (ref 0–44)
AST: 19 U/L (ref 15–41)
Albumin: 4 g/dL (ref 3.5–5.0)
Alkaline Phosphatase: 54 U/L (ref 38–126)
Bilirubin, Direct: <0.1 mg/dL (ref 0.0–0.2)
Total Bilirubin: 0.7 mg/dL (ref 0.3–1.2)
Total Protein: 7 g/dL (ref 6.5–8.1)

## 2019-03-18 LAB — TROPONIN I (HIGH SENSITIVITY)
Troponin I (High Sensitivity): 5 ng/L (ref <18)
Troponin I (High Sensitivity): 3 ng/L (ref <18)

## 2019-03-18 LAB — LIPASE, BLOOD: Lipase: 27 U/L (ref 11–51)

## 2019-03-18 MED ORDER — SODIUM CHLORIDE 0.9% FLUSH: 3.0000 mL | Freq: Once | INTRAVENOUS | Status: DC

## 2019-03-18 NOTE — ED Notes (Signed)
Pt spouse to front desk inquiring about an update; pt's primary RN notified to have patient call him.

## 2019-03-18 NOTE — ED Triage Notes (Addendum)
Pt states she has been having left shoulder pain for a while with numbness today intermittent nausea, states today not feeling well and states her b/p 177/123.Marland Kitchen pt is ambulatory to triage in NAD, with a steady gait.

## 2019-03-18 NOTE — Discharge Instructions (Addendum)
Thank you for letting us take care of you in the emergency department today.   Please continue to take any regular, prescribed medications. Use heat and massage to relax your muscles. Try taking ibuprofen or Aleve as directed on the box for the next few days.  Avoid any heavy lifting or strenuous exercises for the next few days.  Please follow up with: - Your primary care doctor to review your ER visit and follow up on your symptoms.  - Cardiology doctor, information below   Please return to the ER for any new or worsening symptoms.

## 2019-03-18 NOTE — ED Provider Notes (Signed)
[facility] Emergency Department Provider Note  ____________________________________________   First MD Initiated Contact with Patient 03/18/19 1008     (approximate)  I have reviewed the triage vital signs and the nursing notes.  History  Chief Complaint Hypertension    HPI Rachel Pratt is a 54 y.o. female with history of HTN, depression who presents to the emergency department for left lateral neck/shoulder discomfort and blood pressure concerns.  Patient has noticed some left lateral neck and shoulder pain over the last few days.  5/10 in severity, described as a discomfort, muscle soreness. No alleviating/aggravating components.  This morning her symptoms were associated with some numbness and tingling down her left arm.  Also associated with some intermittent waves of nausea (which has also been going on for a few days).  She checked her blood pressure because of her symptoms, was reportedly hypertensive at home 170s/120s, which concerned her about a cardiac etiology, and prompted her to seek care.  She did take an aspirin prior to arrival.  She denies any tobacco use.  No personal history of CAD. No trauma or heavy lifting or increased activity.   Past Medical Hx Past Medical History:  Diagnosis Date  . Depression   . Hypertension   . Trigeminal neuralgia     Problem List There are no problems to display for this patient.   Past Surgical Hx Past Surgical History:  Procedure Laterality Date  . CHOLECYSTECTOMY    . GANGLION CYST EXCISION Right   . TUBAL LIGATION      Medications Prior to Admission medications   Medication Sig Start Date End Date Taking? Authorizing Provider  amphetamine-dextroamphetamine (ADDERALL) 5 MG tablet Take 1 to 2 tablets by mouth in the morning 07/13/16   Elvin So, MD  benzonatate (TESSALON) 200 MG capsule Take 1 capsule (200 mg total) by mouth 3 (three) times daily. As necessary for coughing Patient not  taking: Reported on 07/07/2016 02/15/16   Crecencio Mc P, PA-C  fexofenadine-pseudoephedrine (ALLEGRA-D) 60-120 MG 12 hr tablet Take 1 tablet by mouth every 12 (twelve) hours. Patient not taking: Reported on 07/07/2016 02/15/16   Crecencio Mc P, PA-C  fluticasone [facility]) 50 MCG/ACT nasal spray Place 2 sprays into both nostrils daily for 10 days. 09/28/18 10/08/18  Kara Dies, NP  modafinil (PROVIGIL) 100 MG tablet Take 100 mg by mouth every morning. 07/02/16   [provider]  predniSONE (DELTASONE) 20 MG tablet Take 2 tablets (40 mg) daily by mouth Patient not taking: Reported on 07/07/2016 02/15/16   Lorin Picket, PA-C  TRI-PREVIFEM 0.18/0.215/0.25 MG-35 MCG tablet Take 1 tablet by mouth daily. 05/22/16   [provider]    Allergies Percocet [oxycodone-acetaminophen] and Codeine sulfate  Family Hx Family History  Problem Relation Age of Onset  . Breast cancer Maternal Aunt 68    Social Hx Social History   Tobacco Use  . Smoking status: Never Smoker  . Smokeless tobacco: Never Used  Substance Use Topics  . Alcohol use: Yes  . Drug use: No     Review of Systems  Constitutional: Negative for fever, chills. Eyes: Negative for visual changes. ENT: Negative for sore throat. Cardiovascular: Negative for chest pain. Respiratory: Negative for shortness of breath. Gastrointestinal: Negative for nausea, vomiting.  Genitourinary: Negative for dysuria. Musculoskeletal: Positive for left lateral neck, shoulder pain Skin: Negative for rash. Neurological: Negative for headaches.   Physical Exam  Vital Signs: ED Triage Vitals  Enc Vitals Group  BP 03/18/19 0957 136/84     Pulse Rate 03/18/19 0957 66     Resp 03/18/19 0957 17     Temp 03/18/19 0957 99.2 F (37.3 C)     Temp Source 03/18/19 0957 Oral     SpO2 03/18/19 0957 96 %     Weight 03/18/19 0955 189 lb (85.7 kg)     Height 03/18/19 0955 5\' 5"  (1.651 m)     Head Circumference --       Peak Flow --      Pain Score 03/18/19 0955 5     Pain Loc --      Pain Edu? --      Excl. in GC? --     Constitutional: Alert and oriented.  Head: Normocephalic. Atraumatic. Eyes: Conjunctivae clear. Sclera anicteric. Nose: No congestion. No rhinorrhea. Mouth/Throat: Wearing mask.  Neck: No stridor.  No midline tenderness.  Tender to palpation along the left trapezius, pain is reproducible with palpation. Cardiovascular: Normal rate, regular rhythm. Extremities well perfused.  2+ symmetric radial pulses by palpation. Respiratory: Normal respiratory effort.  Lungs CTAB. Gastrointestinal: Soft. Non-tender. Non-distended.  Musculoskeletal: Trace pre-tibial edema bilaterally, symmetric. FROM of R shoulder w/o discomfort.  No erythema, warmth, swelling. Neurologic:  Normal speech and language. No gross focal neurologic deficits are appreciated.  Skin: Skin is warm, dry and intact. No rash noted. Psychiatric: Mood and affect are appropriate for situation.  EKG  Personally reviewed.   Rate: 77 Rhythm: sinus Axis: normal Intervals: WNL Incomplete left bundle No STEMI    Radiology  CXR: IMPRESSION:  No acute cardiopulmonary findings.    Procedures  Procedure(s) performed (including critical care):  Procedures   Initial Impression / Assessment and Plan / ED Course  54 y.o. female who presents to the ED for left sided neck and shoulder discomfort, as above  Ddx: suspect likely MSK etiology, also consider cervical radiculopathy, atypical ACS, anxiety  Will obtain labs, EKG, XR.   Troponin x 2 negative.  CXR negative.  Remainder of labs without actionable derangements.  As such, patient is stable for discharge with outpatient follow-up.  Advised supportive care with heat, massage, NSAIDs.  Provided information for cardiology follow-up as well.  Patient comfortable with the plan of care and discharge.  Given return precautions.   Final Clinical Impression(s) / ED  Diagnosis  Final diagnoses:  Musculoskeletal neck pain       Note:  This document was prepared using Dragon voice recognition software and may include unintentional dictation errors.   44., MD 03/18/19 [phone]

## 2019-03-29 ENCOUNTER — Ambulatory Visit: Payer: 59 | Admitting: Cardiology

## 2019-04-01 ENCOUNTER — Ambulatory Visit: Payer: 59 | Admitting: Cardiology

## 2019-04-02 ENCOUNTER — Encounter: Payer: Self-pay | Admitting: Cardiology

## 2019-04-08 ENCOUNTER — Other Ambulatory Visit: Payer: Self-pay | Admitting: Family Medicine

## 2019-04-08 ENCOUNTER — Other Ambulatory Visit: Payer: Self-pay | Admitting: Obstetrics and Gynecology

## 2019-04-08 DIAGNOSIS — Z1231 Encounter for screening mammogram for malignant neoplasm of breast: Secondary | ICD-10-CM

## 2019-04-09 ENCOUNTER — Ambulatory Visit
Admission: RE | Admit: 2019-04-09 | Discharge: 2019-04-09 | Disposition: A | Discharge status: Home / Self Care | Payer: 59 | Source: Ambulatory Visit | Attending: Family Medicine | Admitting: Family Medicine

## 2019-04-09 ENCOUNTER — Other Ambulatory Visit: Payer: Self-pay

## 2019-04-09 DIAGNOSIS — Z1231 Encounter for screening mammogram for malignant neoplasm of breast: Secondary | ICD-10-CM

## 2020-03-20 ENCOUNTER — Other Ambulatory Visit: Payer: Self-pay | Admitting: Obstetrics and Gynecology

## 2020-03-20 DIAGNOSIS — Z1231 Encounter for screening mammogram for malignant neoplasm of breast: Secondary | ICD-10-CM

## 2020-04-09 ENCOUNTER — Ambulatory Visit
Admission: RE | Admit: 2020-04-09 | Discharge: 2020-04-09 | Disposition: A | Discharge status: Home / Self Care | Payer: 59 | Source: Ambulatory Visit | Attending: Obstetrics and Gynecology | Admitting: Obstetrics and Gynecology

## 2020-04-09 ENCOUNTER — Other Ambulatory Visit: Payer: Self-pay

## 2020-04-09 DIAGNOSIS — Z1231 Encounter for screening mammogram for malignant neoplasm of breast: Secondary | ICD-10-CM | POA: Insufficient documentation

## 2020-04-24 ENCOUNTER — Other Ambulatory Visit: Admission: RE | Admit: 2020-04-24 | Payer: 59 | Source: Ambulatory Visit

## 2020-04-27 ENCOUNTER — Encounter: Payer: Self-pay | Admitting: *Deleted

## 2020-04-28 ENCOUNTER — Encounter: Payer: Self-pay | Admitting: Anesthesiology

## 2020-04-28 ENCOUNTER — Ambulatory Visit: Admission: RE | Admit: 2020-04-28 | Payer: 59 | Source: Home / Self Care

## 2020-04-28 ENCOUNTER — Other Ambulatory Visit: Payer: Self-pay

## 2020-04-28 ENCOUNTER — Encounter: Admission: RE | Payer: Self-pay | Source: Home / Self Care

## 2020-04-28 SURGERY — ESOPHAGOGASTRODUODENOSCOPY (EGD) WITH PROPOFOL
Anesthesia: General

## 2020-06-26 ENCOUNTER — Encounter: Payer: Self-pay | Admitting: *Deleted

## 2020-06-29 ENCOUNTER — Encounter
Admission: RE | Disposition: A | Discharge status: Home / Self Care | Payer: Self-pay | Source: Home / Self Care | Attending: Gastroenterology

## 2020-06-29 ENCOUNTER — Ambulatory Visit
Admission: RE | Admit: 2020-06-29 | Discharge: 2020-06-29 | Disposition: A | Discharge status: Home / Self Care | Payer: 59 | Attending: Gastroenterology | Admitting: Gastroenterology

## 2020-06-29 ENCOUNTER — Ambulatory Visit: Payer: 59 | Admitting: Anesthesiology

## 2020-06-29 DIAGNOSIS — Z8759 Personal history of other complications of pregnancy, childbirth and the puerperium: Secondary | ICD-10-CM | POA: Diagnosis not present

## 2020-06-29 DIAGNOSIS — Z79899 Other long term (current) drug therapy: Secondary | ICD-10-CM | POA: Diagnosis not present

## 2020-06-29 DIAGNOSIS — Z1211 Encounter for screening for malignant neoplasm of colon: Secondary | ICD-10-CM | POA: Diagnosis present

## 2020-06-29 DIAGNOSIS — Z9049 Acquired absence of other specified parts of digestive tract: Secondary | ICD-10-CM | POA: Diagnosis not present

## 2020-06-29 DIAGNOSIS — Z885 Allergy status to narcotic agent status: Secondary | ICD-10-CM | POA: Insufficient documentation

## 2020-06-29 DIAGNOSIS — I10 Essential (primary) hypertension: Secondary | ICD-10-CM | POA: Insufficient documentation

## 2020-06-29 DIAGNOSIS — G629 Polyneuropathy, unspecified: Secondary | ICD-10-CM | POA: Insufficient documentation

## 2020-06-29 DIAGNOSIS — K640 First degree hemorrhoids: Secondary | ICD-10-CM | POA: Diagnosis not present

## 2020-06-29 HISTORY — DX: Fibromyalgia: M79.7

## 2020-06-29 HISTORY — DX: Polyneuropathy, unspecified: G62.9

## 2020-06-29 HISTORY — PX: COLONOSCOPY WITH PROPOFOL: SHX5780

## 2020-06-29 SURGERY — COLONOSCOPY WITH PROPOFOL
Anesthesia: General

## 2020-06-29 MED ORDER — SODIUM CHLORIDE 0.9 % IV SOLN
INTRAVENOUS | Status: DC
  Administered 2020-06-29: 20 mL/h via INTRAVENOUS

## 2020-06-29 MED ORDER — MIDAZOLAM HCL 2 MG/2ML IJ SOLN
INTRAMUSCULAR | Status: AC
  Filled 2020-06-29: qty 2

## 2020-06-29 MED ORDER — FENTANYL CITRATE (PF) 100 MCG/2ML IJ SOLN
INTRAMUSCULAR | Status: DC | PRN
  Administered 2020-06-29 (×2): 50 ug via INTRAVENOUS

## 2020-06-29 MED ORDER — LIDOCAINE HCL (CARDIAC) PF 100 MG/5ML IV SOSY
PREFILLED_SYRINGE | INTRAVENOUS | Status: DC | PRN
  Administered 2020-06-29: 50 mg via INTRAVENOUS

## 2020-06-29 MED ORDER — FENTANYL CITRATE (PF) 100 MCG/2ML IJ SOLN
INTRAMUSCULAR | Status: AC
  Filled 2020-06-29: qty 2

## 2020-06-29 MED ORDER — PROPOFOL 500 MG/50ML IV EMUL
INTRAVENOUS | Status: AC
  Filled 2020-06-29: qty 50

## 2020-06-29 MED ORDER — MIDAZOLAM HCL 2 MG/2ML IJ SOLN
INTRAMUSCULAR | Status: DC | PRN
  Administered 2020-06-29: 2 mg via INTRAVENOUS

## 2020-06-29 MED ORDER — PROPOFOL 500 MG/50ML IV EMUL
INTRAVENOUS | Status: DC | PRN
  Administered 2020-06-29: 50 ug/kg/min via INTRAVENOUS

## 2020-06-29 MED ORDER — PROPOFOL 10 MG/ML IV BOLUS
INTRAVENOUS | Status: DC | PRN
  Administered 2020-06-29: 20 mg via INTRAVENOUS
  Administered 2020-06-29: 30 mg via INTRAVENOUS

## 2020-06-29 NOTE — Interval H&P Note (Signed)
History and Physical Interval Note:  06/29/2020 12:54 PM  Rachel Pratt  has presented today for surgery, with the diagnosis of cancer screening.  The various methods of treatment have been discussed with the patient and family. After consideration of risks, benefits and other options for treatment, the patient has consented to  Procedure(s): COLONOSCOPY WITH PROPOFOL (N/A) as a surgical intervention.  The patient's history has been reviewed, patient examined, no change in status, stable for surgery.  I have reviewed the patient's chart and labs.  Questions were answered to the patient's satisfaction.     Regis Bill  Ok to proceed with colonoscopy

## 2020-06-29 NOTE — Op Note (Signed)
[facility] Gastroenterology Patient Name: Rachel Pratt Procedure Date: 06/29/2020 12:03 PM MRN: [id number] Account #: [id number] Date of Birth: 25-Dec-1965 Admit Type: Outpatient Age: 55 Room: [facility] ENDO ROOM 3 Gender: Female Note Status: Finalized Procedure:             Colonoscopy Indications:           Screening for colorectal malignant neoplasm Providers:             Andrey Farmer MD, MD Referring MD:          Dion Body (Referring MD) Medicines:             Monitored Anesthesia Care Complications:         No immediate complications. Procedure:             Pre-Anesthesia Assessment:                        - Prior to the procedure, a History and Physical was                         performed, and patient medications and allergies were                         reviewed. The patient is competent. The risks and                         benefits of the procedure and the sedation options and                         risks were discussed with the patient. All questions                         were answered and informed consent was obtained.                         Patient identification and proposed procedure were                         verified by the physician, the nurse, the anesthetist                         and the technician in the endoscopy suite. Mental                         Status Examination: alert and oriented. Airway                         Examination: normal oropharyngeal airway and neck                         mobility. Respiratory Examination: clear to                         auscultation. CV Examination: normal. Prophylactic                         Antibiotics: The patient does not require prophylactic  antibiotics. Prior Anticoagulants: The patient has                         taken no previous anticoagulant or antiplatelet                         agents. ASA Grade Assessment: II - A patient with mild                          systemic disease. After reviewing the risks and                         benefits, the patient was deemed in satisfactory                         condition to undergo the procedure. The anesthesia                         plan was to use monitored anesthesia care (MAC).                         Immediately prior to administration of medications,                         the patient was re-assessed for adequacy to receive                         sedatives. The heart rate, respiratory rate, oxygen                         saturations, blood pressure, adequacy of pulmonary                         ventilation, and response to care were monitored                         throughout the procedure. The physical status of the                         patient was re-assessed after the procedure.                        After obtaining informed consent, the colonoscope was                         passed under direct vision. Throughout the procedure,                         the patient's blood pressure, pulse, and oxygen                         saturations were monitored continuously. The                         Colonoscope was introduced through the anus and                         advanced to the the terminal ileum. The colonoscopy  was performed without difficulty. The patient                         tolerated the procedure well. The quality of the bowel                         preparation was good. Findings:      The perianal and digital rectal examinations were normal.      The terminal ileum appeared normal.      Internal hemorrhoids were found during retroflexion. The hemorrhoids       were Grade I (internal hemorrhoids that do not prolapse).      The exam was otherwise without abnormality on direct and retroflexion       views. Impression:            - The examined portion of the ileum was normal.                        - Internal hemorrhoids.                        -  The examination was otherwise normal on direct and                         retroflexion views.                        - No specimens collected. Recommendation:        - Discharge patient to home.                        - Resume previous diet.                        - Continue present medications.                        - Repeat colonoscopy in 10 years for screening                         purposes.                        - Return to referring physician as previously                         scheduled. Procedure Code(s):     --- Professional ---                        O8325, Colorectal cancer screening; colonoscopy on                         individual not meeting criteria for high risk Diagnosis Code(s):     --- Professional ---                        Z12.11, Encounter for screening for malignant neoplasm                         of colon  K64.0, First degree hemorrhoids CPT copyright 2019 American Medical Association. All rights reserved. The codes documented in this report are preliminary and upon coder review may  be revised to meet current compliance requirements. Andrey Farmer MD, MD 06/29/2020 1:21:44 PM Number of Addenda: 0 Note Initiated On: 06/29/2020 12:03 PM Scope Withdrawal Time: 0 hours 6 minutes 42 seconds  Total Procedure Duration: 0 hours 14 minutes 37 seconds  Estimated Blood Loss:  Estimated blood loss: none.      [facility]

## 2020-06-29 NOTE — H&P (Signed)
Outpatient short stay form Pre-procedure 06/29/2020 12:52 PM Merlyn Lot MD, MPH  Primary Physician: Dr. Burnadette Pop  Reason for visit:  Screening  History of present illness:   55 y/o lady with history of ectopic pregnancy and gall stones here for screening colonoscopy. No blood thinners. Hx of ectopic surgery and history of cholecystectomy. No significant symptoms at this time.    Current Facility-Administered Medications:    0.9 %  sodium chloride infusion, , Intravenous, Continuous, Novalie Leamy, Rossie Muskrat, MD, Last Rate: 20 mL/hr at 06/29/20 1231, 20 mL/hr at 06/29/20 1231  Medications Prior to Admission  Medication Sig Dispense Refill Last Dose   acyclovir (ZOVIRAX) 200 MG capsule Take 200 mg by mouth 2 (two) times daily as needed.   Past Week   Ascorbic Acid (VITAMIN C) 1000 MG tablet Take 1,000 mg by mouth daily.   Past Week   Azelaic Acid (FINACEA) 15 % FOAM Apply 1 application topically daily.   Past Week   calcium-vitamin D (OSCAL WITH D) 500-200 MG-UNIT tablet Take 1 tablet by mouth.   Past Week   CHOLECALCIFEROL PO Take 1,000 Units by mouth daily.   Past Week   gabapentin (NEURONTIN) 300 MG capsule Take 300 mg by mouth at bedtime.   Past Week   ibuprofen (ADVIL) 800 MG tablet Take 800 mg by mouth every 6 (six) hours as needed.   Past Week   Multiple Vitamin (MULTIVITAMIN) tablet Take 1 tablet by mouth daily.   Past Week   TRI-PREVIFEM 0.18/0.215/0.25 MG-35 MCG tablet Take 1 tablet by mouth daily.  6 06/28/2020   triazolam (HALCION) 0.25 MG tablet Take 0.25 mg by mouth at bedtime as needed for sleep. Take 1 tablet by  mouth 1 hour prior to dental appointment   Past Week     Allergies  Allergen Reactions   Mango Flavor    Codeine Sulfate Hives, Rash and Other (See Comments)    Migraine   Percocet [Oxycodone-Acetaminophen] Rash   Qsymia [Phentermine-Topiramate] Other (See Comments)     Past Medical History:  Diagnosis Date   Depression    Fibromyalgia     Hypertension    Neuropathy    Trigeminal neuralgia     Review of systems:  Otherwise negative.    Physical Exam  Gen: Alert, oriented. Appears stated age.  HEENT: PERRLA. Lungs: No respiratory distress CV: RRR Abd: soft, benign, no masses Ext: No edema    Planned procedures: Proceed with colonoscopy. The patient understands the nature of the planned procedure, indications, risks, alternatives and potential complications including but not limited to bleeding, infection, perforation, damage to internal organs and possible oversedation/side effects from anesthesia. The patient agrees and gives consent to proceed.  Please refer to procedure notes for findings, recommendations and patient disposition/instructions.     Merlyn Lot MD, MPH Gastroenterology 06/29/2020  12:52 PM

## 2020-06-29 NOTE — Anesthesia Postprocedure Evaluation (Signed)
Anesthesia Post Note  Patient: Hibba Schram  Procedure(s) Performed: COLONOSCOPY WITH PROPOFOL  Patient location during evaluation: Phase II Anesthesia Type: General Level of consciousness: awake and alert, awake and oriented Pain management: pain level controlled Vital Signs Assessment: post-procedure vital signs reviewed and stable Respiratory status: spontaneous breathing, nonlabored ventilation and respiratory function stable Cardiovascular status: blood pressure returned to baseline and stable Postop Assessment: no apparent nausea or vomiting Anesthetic complications: no   No notable events documented.   Last Vitals:  Vitals:   06/29/20 1333 06/29/20 1343  BP: (!) 105/55 135/72  Pulse: (!) 53 61  Resp: 16 16  Temp:    SpO2: 98% 100%    Last Pain:  Vitals:   06/29/20 1343  TempSrc:   PainSc: 0-No pain                 Manfred Arch

## 2020-06-29 NOTE — Transfer of Care (Signed)
Immediate Anesthesia Transfer of Care Note  Patient: Rachel Pratt  Procedure(s) Performed: COLONOSCOPY WITH PROPOFOL  Patient Location: PACU  Anesthesia Type:General  Level of Consciousness: sedated  Airway & Oxygen Therapy: Patient Spontanous Breathing and Patient connected to nasal cannula oxygen  Post-op Assessment: Report given to RN and Post -op Vital signs reviewed and stable  Post vital signs: Reviewed and stable  Last Vitals:  Vitals Value Taken Time  BP 96/51 06/29/20 1323  Temp 36.2 C 06/29/20 1323  Pulse 63 06/29/20 1323  Resp 18 06/29/20 1323  SpO2 98 % 06/29/20 1323  Vitals shown include unvalidated device data.  Last Pain:  Vitals:   06/29/20 1323  TempSrc: Temporal  PainSc: 0-No pain         Complications: No notable events documented.

## 2020-06-29 NOTE — Anesthesia Preprocedure Evaluation (Signed)
Anesthesia Evaluation  Patient identified by MRN, date of birth, ID band Patient awake    Reviewed: Allergy & Precautions, NPO status , Patient's Chart, lab work & pertinent test results  Airway Mallampati: II  TM Distance: >3 FB Neck ROM: Full    Dental no notable dental hx.    Pulmonary neg pulmonary ROS, former smoker,    Pulmonary exam normal        Cardiovascular hypertension, Normal cardiovascular exam     Neuro/Psych PSYCHIATRIC DISORDERS Depression  Neuromuscular disease    GI/Hepatic negative GI ROS, Neg liver ROS,   Endo/Other  negative endocrine ROS  Renal/GU negative Renal ROS  negative genitourinary   Musculoskeletal  (+) Fibromyalgia -  Abdominal   Peds negative pediatric ROS (+)  Hematology negative hematology ROS (+)   Anesthesia Other Findings . Basal cell carcinoma 2 yrs ago  not sure just had stuff removed at dermatologist office  . Fibromyalgia  . Fracture of lower end of tibia, unspecified laterality, open type III, initial encounter  . History of depression  and anxiety, followed by [facility] (Dr. Daleen Squibb)  . History of mammogram 06/06/08  . History of Papanicolaou smear of cervix 07/28/10  . History of trigeminal neuralgia  recurrent  . Neuropathy 5-6 years ago  ankle  . Vision abnormalities 3 years off and on  told dry eye but drops don't work to correct blur     Reproductive/Obstetrics negative OB ROS                             Anesthesia Physical Anesthesia Plan  ASA: 2  Anesthesia Plan: General   Post-op Pain Management:    Induction: Intravenous  PONV Risk Score and Plan: 2 and Propofol infusion and TIVA  Airway Management Planned: Natural Airway and Nasal Cannula  Additional Equipment:   Intra-op Plan:   Post-operative Plan:   Informed Consent: I have reviewed the patients History and Physical, chart, labs and discussed the  procedure including the risks, benefits and alternatives for the proposed anesthesia with the patient or authorized representative who has indicated his/her understanding and acceptance.       Plan Discussed with: CRNA, Anesthesiologist and Surgeon  Anesthesia Plan Comments:         Anesthesia Quick Evaluation

## 2020-06-30 ENCOUNTER — Encounter: Payer: Self-pay | Admitting: Gastroenterology

## 2020-10-05 ENCOUNTER — Encounter: Payer: Self-pay | Admitting: Emergency Medicine

## 2020-10-05 ENCOUNTER — Other Ambulatory Visit: Payer: Self-pay

## 2020-10-05 ENCOUNTER — Ambulatory Visit
Admission: EM | Admit: 2020-10-05 | Discharge: 2020-10-05 | Disposition: A | Discharge status: Home / Self Care | Payer: 59 | Attending: Emergency Medicine | Admitting: Emergency Medicine

## 2020-10-05 DIAGNOSIS — M25551 Pain in right hip: Secondary | ICD-10-CM | POA: Diagnosis not present

## 2020-10-05 DIAGNOSIS — W57XXXA Bitten or stung by nonvenomous insect and other nonvenomous arthropods, initial encounter: Secondary | ICD-10-CM | POA: Diagnosis not present

## 2020-10-05 DIAGNOSIS — R519 Headache, unspecified: Secondary | ICD-10-CM | POA: Insufficient documentation

## 2020-10-05 DIAGNOSIS — S20161A Insect bite (nonvenomous) of breast, right breast, initial encounter: Secondary | ICD-10-CM | POA: Diagnosis not present

## 2020-10-05 DIAGNOSIS — M549 Dorsalgia, unspecified: Secondary | ICD-10-CM | POA: Diagnosis not present

## 2020-10-05 DIAGNOSIS — Z885 Allergy status to narcotic agent status: Secondary | ICD-10-CM | POA: Insufficient documentation

## 2020-10-05 DIAGNOSIS — R21 Rash and other nonspecific skin eruption: Secondary | ICD-10-CM | POA: Diagnosis present

## 2020-10-05 DIAGNOSIS — M25552 Pain in left hip: Secondary | ICD-10-CM | POA: Diagnosis not present

## 2020-10-05 DIAGNOSIS — Z87891 Personal history of nicotine dependence: Secondary | ICD-10-CM | POA: Diagnosis not present

## 2020-10-05 DIAGNOSIS — R5383 Other fatigue: Secondary | ICD-10-CM | POA: Diagnosis not present

## 2020-10-05 DIAGNOSIS — M797 Fibromyalgia: Secondary | ICD-10-CM | POA: Diagnosis not present

## 2020-10-05 MED ORDER — DOXYCYCLINE HYCLATE 100 MG PO CAPS: 100.0000 mg | ORAL_CAPSULE | Freq: Two times a day (BID) | ORAL | 0 refills | Status: AC

## 2020-10-05 NOTE — ED Provider Notes (Signed)
[facility]    CSN: [id number] Arrival date & time: 10/05/20  1017      History   Chief Complaint Chief Complaint  Patient presents with   Rash    HPI Rachel Pratt is a 55 y.o. female.   HPI  69 old female here for evaluation of insect bite.  Patient reports that she was bitten by an insect on her right breast approximately 2 weeks ago.  She was doing her monthly self breast exam and noticed that there was a red ring around the bite area 3 days ago.  She states that in the past week she has "felt bad" to include body aches, headache, joint pain in her hips and knees, back pain, and fatigue.  She has not noticed any other rashes and has not measured a fever at home.  She states she is unsure if the symptoms are related to the insect bite or are simply a fibromyalgia flare.  Past Medical History:  Diagnosis Date   Depression    Fibromyalgia    Hypertension    Neuropathy    Trigeminal neuralgia     There are no problems to display for this patient.   Past Surgical History:  Procedure Laterality Date   CHOLECYSTECTOMY     COLONOSCOPY WITH PROPOFOL N/A 06/29/2020   Procedure: COLONOSCOPY WITH PROPOFOL;  Surgeon: Regis Bill, MD;  Location: [facility] ENDOSCOPY;  Service: Endoscopy;  Laterality: N/A;   GANGLION CYST EXCISION Right    TUBAL LIGATION      OB History   No obstetric history on file.      Home Medications    Prior to Admission medications   Medication Sig Start Date End Date Taking? Authorizing Provider  doxycycline (VIBRAMYCIN) 100 MG capsule Take 1 capsule (100 mg total) by mouth 2 (two) times daily for 14 days. 10/05/20 10/19/20 Yes Becky Augusta, NP  acyclovir (ZOVIRAX) 200 MG capsule Take 200 mg by mouth 2 (two) times daily as needed.    [provider]  Ascorbic Acid (VITAMIN C) 1000 MG tablet Take 1,000 mg by mouth daily.    [provider]  Azelaic Acid (FINACEA) 15 % FOAM Apply 1 application topically daily.     [provider]  calcium-vitamin D (OSCAL WITH D) 500-200 MG-UNIT tablet Take 1 tablet by mouth.    [provider]  CHOLECALCIFEROL PO Take 1,000 Units by mouth daily.    [provider]  gabapentin (NEURONTIN) 300 MG capsule Take 300 mg by mouth at bedtime.    [provider]  ibuprofen (ADVIL) 800 MG tablet Take 800 mg by mouth every 6 (six) hours as needed.    [provider]  Multiple Vitamin (MULTIVITAMIN) tablet Take 1 tablet by mouth daily.    [provider]  TRI-PREVIFEM 0.18/0.215/0.25 MG-35 MCG tablet Take 1 tablet by mouth daily. 05/22/16   [provider]  triazolam (HALCION) 0.25 MG tablet Take 0.25 mg by mouth at bedtime as needed for sleep. Take 1 tablet by  mouth 1 hour prior to dental appointment    [provider]    Family History Family History  Problem Relation Age of Onset   Breast cancer Maternal Aunt 42    Social History Social History   Tobacco Use   Smoking status: Former    Years: 27.00    Types: Cigarettes    Quit date: 01/17/1993    Years since quitting: 27.7   Smokeless tobacco: Never  Vaping Use   Vaping Use: Never used  Substance Use Topics   Alcohol use: Not Currently   Drug use: No     Allergies   Mango flavor, Codeine sulfate, Percocet [oxycodone-acetaminophen], and Qsymia [phentermine-topiramate]   Review of Systems Review of Systems  Constitutional:  Positive for fatigue. Negative for activity change, appetite change and fever.  Musculoskeletal:  Positive for arthralgias and back pain.  Skin:  Positive for color change and rash.  Neurological:  Positive for headaches.  Psychiatric/Behavioral: Negative.      Physical Exam Triage Vital Signs ED Triage Vitals  Enc Vitals Group     BP 10/05/20 1136 135/65     Pulse Rate 10/05/20 1136 80     Resp 10/05/20 1136 18     Temp 10/05/20 1136 98.5 F (36.9 C)     Temp Source 10/05/20 1136 Oral     SpO2 10/05/20  1136 96 %     Weight --      Height --      Head Circumference --      Peak Flow --      Pain Score 10/05/20 1135 0     Pain Loc --      Pain Edu? --      Excl. in GC? --    No data found.  Updated Vital Signs BP 135/65 (BP Location: Left Arm)   Pulse 80   Temp 98.5 F (36.9 C) (Oral)   Resp 18   LMP 09/04/2020   SpO2 96%   Visual Acuity Right Eye Distance:   Left Eye Distance:   Bilateral Distance:    Right Eye Near:   Left Eye Near:    Bilateral Near:     Physical Exam Vitals and nursing note reviewed. Exam conducted with a chaperone present Lowanda Foster, CMA).  Constitutional:      General: She is not in acute distress.    Appearance: Normal appearance. She is normal weight. She is not ill-appearing.  HENT:     Head: Normocephalic and atraumatic.  Skin:    General: Skin is warm and dry.     Capillary Refill: Capillary refill takes less than 2 seconds.     Findings: Erythema and rash present.  Neurological:     General: No focal deficit present.     Mental Status: She is alert and oriented to person, place, and time.  Psychiatric:        Mood and Affect: Mood normal.        Behavior: Behavior normal.        Thought Content: Thought content normal.        Judgment: Judgment normal.     UC Treatments / Results  Labs (all labs ordered are listed, but only abnormal results are displayed) Labs Reviewed  LYME DISEASE, WESTERN BLOT  RICKETTSIAL FEVER ABS  EHRLICHIA ANTIBODY PANEL  BARTONELLA ANTIBODY PANEL    EKG   Radiology No results found.  Procedures Procedures (including critical care time)  Medications Ordered in UC Medications - No data to display  Initial Impression / Assessment and Plan / UC Course  I have reviewed the triage vital signs and the nursing notes.  Pertinent labs & imaging results that were available during my care of the patient were reviewed by me and considered in my medical decision making (see chart for  details).  Patient is a nontoxic-appearing 62 old female here for evaluation of a rash on her right breast that she noticed  3 days ago and is surrounding the area where she was bitten by an insect 2 weeks ago.  She does spend a lot of time outside and she denies having pulled any ticks off of her.  She is unsure what bit her though on her right breast.  She had not noticed the redness until she did her breast self-exam because it is on the right lateral aspect of her breast back close to her chest wall.  She did take a picture of it with her phone which shows central red area with a surrounding area of redness.  On visual inspection the area is approximately the size of a $0.50 piece and is not indurated, fluctuant, or hot.  There is erythema but it is flat and blanchable.  Given patient's sequela of symptoms is concerning as to whether or not this is a tickborne illness versus just a fibromyalgia flare and localized histamine reaction.  We will draw tick labs and start empiric therapy with doxycycline twice daily for 14 days.  Depending on the results of the tickborne labs we can adjust therapy for longer duration if required.   Final Clinical Impressions(s) / UC Diagnoses   Final diagnoses:  Rash and nonspecific skin eruption  Insect bite of right breast, initial encounter     Discharge Instructions      There is concern given the sequela of your symptoms that you may be suffering from a tickborne illness.  We will draw labs to check for Lyme, [facility] spotted fever, ehrlichiosis, Bartonella, and Rickettsia.  Begin empiric therapy with doxycycline twice daily for 14 days.  Take this medication with food.  This medication will make you more sensitive to sunburn so make sure that you are wearing sunscreen when you are outdoors with SPF of 30 or higher.  Please reapply the sunscreen every 90 minutes to prevent sunburn.  Do not forget to apply sunscreen to your scalp and your part in your  hair.  If the tickborne labs are all negative we will stop the antibiotic therapy.  Return for reevaluation for new or worsening symptoms or follow-up with your primary care provider.     ED Prescriptions     Medication Sig Dispense Auth. Provider   doxycycline (VIBRAMYCIN) 100 MG capsule Take 1 capsule (100 mg total) by mouth 2 (two) times daily for 14 days. 28 capsule Becky Augusta, NP      PDMP not reviewed this encounter.   Becky Augusta, NP 10/05/20 1201

## 2020-10-05 NOTE — Discharge Instructions (Addendum)
There is concern given the sequela of your symptoms that you may be suffering from a tickborne illness.  We will draw labs to check for Lyme, [facility] spotted fever, ehrlichiosis, Bartonella, and Rickettsia.  Begin empiric therapy with doxycycline twice daily for 14 days.  Take this medication with food.  This medication will make you more sensitive to sunburn so make sure that you are wearing sunscreen when you are outdoors with SPF of 30 or higher.  Please reapply the sunscreen every 90 minutes to prevent sunburn.  Do not forget to apply sunscreen to your scalp and your part in your hair.  If the tickborne labs are all negative we will stop the antibiotic therapy.  Return for reevaluation for new or worsening symptoms or follow-up with your primary care provider.

## 2020-10-05 NOTE — ED Triage Notes (Signed)
Pt presents today with c/o insect bite to right breast that occurred approx 2 weeks. She noticed it has a red ring around site. She c/o of body aches.

## 2020-10-06 LAB — RICKETTSIAL FEVER ABS
Q Fever Phase I: NEGATIVE
Q Fever Phase II: NEGATIVE
RMSF IgG: NEGATIVE

## 2020-10-06 LAB — BARTONELLA ANTIBODY PANEL
B Quintana IgM: NEGATIVE titer
B henselae IgG: NEGATIVE titer
B henselae IgM: NEGATIVE titer
B quintana IgG: NEGATIVE titer

## 2020-10-07 LAB — EHRLICHIA ANTIBODY PANEL
E chaffeensis (HGE) Ab, IgG: NEGATIVE
E chaffeensis (HGE) Ab, IgM: NEGATIVE
E. Chaffeensis (HME) IgM Titer: NEGATIVE
E.Chaffeensis (HME) IgG: NEGATIVE

## 2020-10-13 LAB — LYME DISEASE, WESTERN BLOT
IgG P18 Ab.: ABSENT
IgG P23 Ab.: ABSENT
IgG P28 Ab.: ABSENT
IgG P30 Ab.: ABSENT
IgG P39 Ab.: ABSENT
IgG P41 Ab.: ABSENT
IgG P45 Ab.: ABSENT
IgG P58 Ab.: ABSENT
IgG P66 Ab.: ABSENT
IgM P23 Ab.: ABSENT
IgM P39 Ab.: ABSENT
IgM P41 Ab.: ABSENT
Lyme IgG Wb: NEGATIVE
Lyme IgM Wb: NEGATIVE

## 2021-02-03 DIAGNOSIS — M797 Fibromyalgia: Secondary | ICD-10-CM | POA: Insufficient documentation

## 2021-06-29 ENCOUNTER — Telehealth: Payer: 59 | Admitting: Physician Assistant

## 2021-06-29 DIAGNOSIS — T63461A Toxic effect of venom of wasps, accidental (unintentional), initial encounter: Secondary | ICD-10-CM | POA: Diagnosis not present

## 2021-06-29 MED ORDER — PREDNISONE 20 MG PO TABS: 40.0000 mg | ORAL_TABLET | Freq: Every day | ORAL | 0 refills | Status: AC

## 2021-06-29 NOTE — Progress Notes (Signed)
E-Visit for Insect Sting  Thank you for describing the insect sting for [place].  Here is how we plan to help!  Giving your current symptoms and levels of swelling I feel you are having a more substantial localized reaction but giving absence of true significant pain I do not feel you have developed a secondary bacterial infection of the skin thankfully.   The 2 greatest risks from insect stings are allergic reaction, which can be fatal in some people and infection, which is more common and less serious.  Bees, wasps, yellow jackets, and hornets belong to a class of insects called Hymenoptera.  Most insect stings cause only minor discomfort.  Stings can happen anywhere on the body and can be painful.  Most stings are from honey bees or yellow jackets.  Fire ants can sting multiple times.  The sites of the stings are more likely to become infected.    Based on your information I have:, Provided a home care guide for insect stings and instructions on when to call for help., and I have sent in prednisone 40 mg by mouth daily for 5 days to the pharmacy you selected.  Please make sure that you selected a pharmacy that is open now.  What can be used to prevent Insect Stings?  Insect repellant with at least 20% DEET.  Wearing long pants and shirts with socks and shoes.  Wear dark or drab-colored clothes rather than bright colors.  Avoid using perfumes and hair sprays; these attract insects.  HOME CARE ADVICE:  1. Stinger removal: The stinger looks like a tiny black dot in the sting. Use a fingernail, credit card edge, or knife-edge to scrape it off.  Don't pull it out because it squeezes out more venom. If the stinger is below the skin surface, leave it alone.  It will be shed with normal skin healing. 2. Use cold compresses to the area of the sting for 10-20 minutes.  You may repeat this as needed to relieve symptoms of pain and swelling. 3.  For pain relief, take acetominophen 650 mg 4-6 hours as  needed or ibuprofen 400 mg every 6-8 hours as needed or naproxen 250-500 mg every 12 hours as needed. 4.  You can also use hydrocortisone cream 0.5% or 1% up to 4 times daily as needed for itching. 5.  If the sting becomes very itchy, take Benadryl 25-50 mg, follow directions on box. 6.  Wash the area 2-3 times daily with antibacterial soap and warm water. 7. Call your Doctor if: Fever, a severe headache, or rash occur in the next 2 weeks. Sting area begins to look infected. Redness and swelling worsens after home treatment. Your current symptoms become worse.    MAKE SURE YOU:  Understand these instructions. Will watch your condition. Will get help right away if you are not doing well or get worse.  Thank you for choosing an e-visit.  Your e-visit answers were reviewed by a board certified advanced clinical practitioner to complete your personal care plan. Depending upon the condition, your plan could have included both over the counter or prescription medications.  Please review your pharmacy choice. Make sure the pharmacy is open so you can pick up prescription now. If there is a problem, you may contact your provider through [employer] and have the prescription routed to another pharmacy.  Your safety is important to [place]. If you have drug allergies check your prescription carefully.   For the next 24 hours you can  use MyChart to ask questions about today's visit, request a non-urgent call back, or ask for a work or school excuse. You will get an email in the next two days asking about your experience. I hope that your e-visit has been valuable and will speed your recovery.

## 2021-06-29 NOTE — Progress Notes (Signed)
I have spent 5 minutes in review of e-visit questionnaire, review and updating patient chart, medical decision making and response to patient.   Sanna Porcaro Cody Aleeah Greeno, PA-C    

## 2021-09-21 ENCOUNTER — Other Ambulatory Visit: Payer: Self-pay | Admitting: Family Medicine

## 2021-09-21 DIAGNOSIS — Z1231 Encounter for screening mammogram for malignant neoplasm of breast: Secondary | ICD-10-CM

## 2021-10-07 ENCOUNTER — Ambulatory Visit
Admission: RE | Admit: 2021-10-07 | Discharge: 2021-10-07 | Disposition: A | Discharge status: Home / Self Care | Payer: BC Managed Care – PPO | Source: Ambulatory Visit | Attending: Family Medicine | Admitting: Family Medicine

## 2021-10-07 DIAGNOSIS — Z1231 Encounter for screening mammogram for malignant neoplasm of breast: Secondary | ICD-10-CM | POA: Diagnosis present

## 2022-02-11 DIAGNOSIS — E7800 Pure hypercholesterolemia, unspecified: Secondary | ICD-10-CM | POA: Insufficient documentation

## 2022-02-11 DIAGNOSIS — I10 Essential (primary) hypertension: Secondary | ICD-10-CM | POA: Insufficient documentation

## 2022-02-15 ENCOUNTER — Other Ambulatory Visit: Payer: Self-pay | Admitting: Family Medicine

## 2022-02-15 DIAGNOSIS — E7800 Pure hypercholesterolemia, unspecified: Secondary | ICD-10-CM

## 2022-02-25 ENCOUNTER — Ambulatory Visit
Admission: RE | Admit: 2022-02-25 | Discharge: 2022-02-25 | Disposition: A | Discharge status: Home / Self Care | Payer: BC Managed Care – PPO | Source: Ambulatory Visit | Attending: Family Medicine | Admitting: Family Medicine

## 2022-02-25 DIAGNOSIS — E7800 Pure hypercholesterolemia, unspecified: Secondary | ICD-10-CM | POA: Insufficient documentation

## 2022-12-20 ENCOUNTER — Other Ambulatory Visit: Payer: Self-pay | Admitting: Family Medicine

## 2022-12-20 DIAGNOSIS — Z1231 Encounter for screening mammogram for malignant neoplasm of breast: Secondary | ICD-10-CM

## 2022-12-22 ENCOUNTER — Ambulatory Visit
Admission: RE | Admit: 2022-12-22 | Discharge: 2022-12-22 | Disposition: A | Discharge status: Home / Self Care | Payer: BC Managed Care – PPO | Source: Ambulatory Visit | Attending: Family Medicine | Admitting: Family Medicine

## 2022-12-22 DIAGNOSIS — Z1231 Encounter for screening mammogram for malignant neoplasm of breast: Secondary | ICD-10-CM | POA: Diagnosis present

## 2023-04-12 NOTE — Progress Notes (Unsigned)
 [facility] [address] [place] [address] Phone: [phone] Subjective:   INadine Counts, am serving as a scribe for Dr. Antoine Primas.  I'm seeing this patient by the request  of:  Marisue Ivan, MD  CC: right side pain   ONG:[id number]  Rachel Pratt is a 58 y.o. female coming in with complaint of side pain.  Seems to be right-sided.  Has had pain previously secondary to oral intake.  Does not remember any true injury.  Not exacerbated by touch or movement.  Has had it intermittently for quite some time likely years but seems to have more severity as well as more duration and episodes.  Patient states right side pain. Chronic pain that reminds her of when she had gallstones. Feels like a stitch in your side, that flares. Heat helps.  Past medical history is significant for cholecystectomy as well as tubal ligation.  History of trigeminal neuralgia.  History of fibromyalgia will    Past Medical History:  Diagnosis Date   Depression    Fibromyalgia    Hypertension    Neuropathy    Trigeminal neuralgia    Past Surgical History:  Procedure Laterality Date   CHOLECYSTECTOMY     COLONOSCOPY WITH PROPOFOL N/A 06/29/2020   Procedure: COLONOSCOPY WITH PROPOFOL;  Surgeon: Regis Bill, MD;  Location: [facility] ENDOSCOPY;  Service: Endoscopy;  Laterality: N/A;   GANGLION CYST EXCISION Right    TUBAL LIGATION     Social History   Socioeconomic History   Marital status: Married    Spouse name: Not on file   Number of children: Not on file   Years of education: Not on file   Highest education level: Not on file  Occupational History   Not on file  Tobacco Use   Smoking status: Former    Current packs/day: 0.00    Types: Cigarettes    Start date: 01/17/1966    Quit date: 01/17/1993    Years since quitting: 30.2   Smokeless tobacco: Never  Vaping Use   Vaping status: Never Used  Substance and Sexual Activity   Alcohol use: Not  Currently   Drug use: No   Sexual activity: Yes    Birth control/protection: Surgical  Other Topics Concern   Not on file  Social History Narrative   Not on file   Social Drivers of Health   Financial Resource Strain: Low Risk  (04/12/2023)   Received from [facility]   Overall Financial Resource Strain (CARDIA)    Difficulty of Paying Living Expenses: Not very hard  Food Insecurity: No Food Insecurity (04/12/2023)   [facility]   Hunger Vital Sign    Worried About Running Out of Food in the Last Year: Never true    Ran Out of Food in the Last Year: Never true  Transportation Needs: No Transportation Needs (04/12/2023)   Received from [facility] - Transportation    In the past 12 months, has lack of transportation kept you from medical appointments or from getting medications?: No    Lack of Transportation (Non-Medical): No  Physical Activity: Not on file  Stress: Not on file  Social Connections: Not on file   Allergies  Allergen Reactions   Mango Flavoring Agent (Non-Screening)    Codeine Sulfate Hives, Rash and Other (See Comments)    Migraine   Percocet [Oxycodone-Acetaminophen] Rash   Qsymia [Phentermine-Topiramate] Other (  See Comments)   Family History  Problem Relation Age of Onset   Breast cancer Maternal Aunt 68    Current Outpatient Medications (Endocrine & Metabolic):    TRI-PREVIFEM 0.18/0.215/0.25 MG-35 MCG tablet, Take 1 tablet by mouth daily.    Current Outpatient Medications (Analgesics):    ibuprofen (ADVIL) 800 MG tablet, Take 800 mg by mouth every 6 (six) hours as needed.   Current Outpatient Medications (Other):    gabapentin (NEURONTIN) 100 MG capsule, Take 2 capsules (200 mg total) by mouth at bedtime.   Vitamin D, Ergocalciferol, (DRISDOL) 1.25 MG (50000 UNIT) CAPS capsule, Take 1 capsule (50,000 Units total) by mouth every 7 (seven) days.   acyclovir (ZOVIRAX) 200  MG capsule, Take 200 mg by mouth 2 (two) times daily as needed.   Ascorbic Acid (VITAMIN C) 1000 MG tablet, Take 1,000 mg by mouth daily.   Azelaic Acid (FINACEA) 15 % FOAM, Apply 1 application topically daily.   calcium-vitamin D (OSCAL WITH D) 500-200 MG-UNIT tablet, Take 1 tablet by mouth.   CHOLECALCIFEROL PO, Take 1,000 Units by mouth daily.   gabapentin (NEURONTIN) 300 MG capsule, Take 300 mg by mouth at bedtime.   Multiple Vitamin (MULTIVITAMIN) tablet, Take 1 tablet by mouth daily.   triazolam (HALCION) 0.25 MG tablet, Take 0.25 mg by mouth at bedtime as needed for sleep. Take 1 tablet by  mouth 1 hour prior to dental appointment   Reviewed prior external information including notes and imaging from  primary care provider As well as notes that were available from care everywhere and other healthcare systems.  Past medical history, social, surgical and family history all reviewed in electronic medical record.  No pertanent information unless stated regarding to the chief complaint.   Review of Systems:  No headache, visual changes, nausea, vomiting, diarrhea, constipation, dizziness,  skin rash, fevers, chills, night sweats, weight loss, swollen lymph nodes, body aches, joint swelling, chest pain, shortness of breath, mood changes. POSITIVE muscle aches, abdominal pain  Objective  Blood pressure 120/82, pulse 73, height 5\' 5"  (1.651 m), weight 196 lb (88.9 kg), SpO2 98%.   General: No apparent distress alert and oriented x3 mood and affect normal, dressed appropriately.  HEENT: Pupils equal, extraocular movements intact  Respiratory: Patient's speak in full sentences and does not appear short of breath  Cardiovascular: No lower extremity edema, non tender, no erythema  On exam no significant masses noted.  No skin lesions.  Abdominal area.  Patient does have some pain that seems to be just anterior to the lateral abdominal area just inferior to the costal margin.  Near the area where  patient has had the incisions from previous abdominal surgery.  Limited muscular skeletal ultrasound was performed and interpreted by Antoine Primas, M  Limited ultrasound of patient's area of discomfort in the right upper quadrant does show some calcific changes that could be either scar tissue formation.  Seems to be in the deep transverse abdominis area.    Impression and Recommendations:    The above documentation has been reviewed and is accurate and complete Judi Saa, DO

## 2023-04-13 ENCOUNTER — Encounter: Payer: Self-pay | Admitting: Family Medicine

## 2023-04-13 ENCOUNTER — Other Ambulatory Visit: Payer: Self-pay

## 2023-04-13 ENCOUNTER — Ambulatory Visit: Admitting: Family Medicine

## 2023-04-13 VITALS — BP 120/82 | HR 73 | Ht 65.0 in | Wt 196.0 lb

## 2023-04-13 DIAGNOSIS — R1010 Upper abdominal pain, unspecified: Secondary | ICD-10-CM

## 2023-04-13 DIAGNOSIS — R109 Unspecified abdominal pain: Secondary | ICD-10-CM | POA: Diagnosis not present

## 2023-04-13 MED ORDER — GABAPENTIN 100 MG PO CAPS: 200.0000 mg | ORAL_CAPSULE | Freq: Every day | ORAL | 0 refills | Status: AC

## 2023-04-13 MED ORDER — VITAMIN D (ERGOCALCIFEROL) 1.25 MG (50000 UNIT) PO CAPS: 50000.0000 [IU] | ORAL_CAPSULE | ORAL | 0 refills | Status: AC

## 2023-04-13 NOTE — Patient Instructions (Signed)
 Vit D prescribed Tart [place] 2400mg  Use massage gun 10 mins 2x a week in area Gabapentin 200mg  to take at night, can use as needed See you again in 2 months

## 2023-04-13 NOTE — Assessment & Plan Note (Addendum)
 Patient's right sided pain seems to be near where the cholecystectomy uterus.  With ultrasound today was found to have some calcific changes noted in the deep musculature noted.  We discussed with patient that there is a chance that this could be causing some of the discomfort and potentially even pushing on a nerve.  We discussed with patient about different treatment options including medications which patient wants against at this juncture.  We did discuss a once weekly vitamin D to help with reabsorption of some of the calcium which patient would like to try.  We discussed if continuing to have difficulty we should consider the possibility of laboratory workup if necessary.  Discussed other treatment options such as injections but would want to hold secondary to the area where this is could be a little more of a complicated procedure.  I think other idea would include an MRI of the abdomen but I do not think it would be necessary at the moment.  Hopefully patient will make significant improvement with conservative therapy.  Gabapentin 200 mg to take at night also prescribed

## 2023-04-14 ENCOUNTER — Other Ambulatory Visit: Payer: Self-pay | Admitting: Obstetrics and Gynecology

## 2023-04-14 DIAGNOSIS — Z1231 Encounter for screening mammogram for malignant neoplasm of breast: Secondary | ICD-10-CM

## 2023-05-19 ENCOUNTER — Ambulatory Visit: Admitting: Family Medicine

## 2023-06-14 NOTE — Progress Notes (Unsigned)
 [facility] [address] [place] [address] Phone: [phone] Subjective:   Rachel [facility]att, am serving as a scribe for Dr. Ronnell Coins.  I'm seeing this patient by the request  of:  Monique Ano, MD  CC: Right-sided abdominal pain  WGN:[id number]  04/13/2023 Patient's right sided pain seems to be near where the cholecystectomy uterus. With ultrasound today was found to have some calcific changes noted in the deep musculature noted. We discussed with patient that there is a chance that this could be causing some of the discomfort and potentially even pushing on a nerve. We discussed with patient about different treatment options including medications which patient wants against at this juncture. We did discuss a once weekly vitamin D  to help with reabsorption of some of the calcium which patient would like to try. We discussed if continuing to have difficulty we should consider the possibility of laboratory workup if necessary. Discussed other treatment options such as injections but would want to hold secondary to the area where this is could be a little more of a complicated procedure. I think other idea would include an MRI of the abdomen but I do not think it would be necessary at the moment. Hopefully patient will make significant improvement with conservative therapy. Gabapentin  200 mg to take at night also prescribed   Update 06/15/2023 Rachel Pratt is a 58 y.o. female coming in with complaint of RUQ pain.  Patient was given 200 mg of gabapentin  as well as once weekly vitamin D  supplementation.  Ultrasound previously did show some calcific changes where the scar tissue formation was.  Patient states pain is worse, but not consistent. Hip starting to hurt and going to ankle and knee.     Past Medical History:  Diagnosis Date   Depression    Fibromyalgia    Hypertension    Neuropathy    Trigeminal neuralgia    Past Surgical History:   Procedure Laterality Date   CHOLECYSTECTOMY     COLONOSCOPY WITH PROPOFOL  N/A 06/29/2020   Procedure: COLONOSCOPY WITH PROPOFOL ;  Surgeon: Shane Darling, MD;  Location: [facility] ENDOSCOPY;  Service: Endoscopy;  Laterality: N/A;   GANGLION CYST EXCISION Right    TUBAL LIGATION     Social History   Socioeconomic History   Marital status: Married    Spouse name: Not on file   Number of children: Not on file   Years of education: Not on file   Highest education level: Not on file  Occupational History   Not on file  Tobacco Use   Smoking status: Former    Current packs/day: 0.00    Types: Cigarettes    Start date: 01/17/1966    Quit date: 01/17/1993    Years since quitting: 30.4   Smokeless tobacco: Never  Vaping Use   Vaping status: Never Used  Substance and Sexual Activity   Alcohol use: Not Currently   Drug use: No   Sexual activity: Yes    Birth control/protection: Surgical  Other Topics Concern   Not on file  Social History Narrative   Not on file   Social Drivers of Health   Financial Resource Strain: Low Risk  (04/12/2023)   Received from [facility]   Overall Financial Resource Strain (CARDIA)    Difficulty of Paying Living Expenses: Not very hard  Food Insecurity: No Food Insecurity (04/12/2023)   Received from [facility]   Hunger Vital Sign  Worried About Programme researcher, broadcasting/film/video in the Last Year: Never true    Ran Out of Food in the Last Year: Never true  Transportation Needs: No Transportation Needs (04/12/2023)   Received from [facility] - Transportation    In the past 12 months, has lack of transportation kept you from medical appointments or from getting medications?: No    Lack of Transportation (Non-Medical): No  Physical Activity: Not on file  Stress: Not on file  Social Connections: Not on file   Allergies  Allergen Reactions   Mango Flavoring Agent (Non-Screening)    Codeine Sulfate  Hives, Rash and Other (See Comments)    Migraine   Percocet [Oxycodone-Acetaminophen] Rash   Qsymia [Phentermine-Topiramate] Other (See Comments)   Family History  Problem Relation Age of Onset   Breast cancer Maternal Aunt 68    Current Outpatient Medications (Endocrine & Metabolic):    TRI-PREVIFEM 0.18/0.215/0.25 MG-35 MCG tablet, Take 1 tablet by mouth daily.    Current Outpatient Medications (Analgesics):    ibuprofen (ADVIL) 800 MG tablet, Take 800 mg by mouth every 6 (six) hours as needed.   Current Outpatient Medications (Other):    acyclovir (ZOVIRAX) 200 MG capsule, Take 200 mg by mouth 2 (two) times daily as needed.   Ascorbic Acid (VITAMIN C) 1000 MG tablet, Take 1,000 mg by mouth daily.   Azelaic Acid (FINACEA) 15 % FOAM, Apply 1 application topically daily.   calcium-vitamin D  (OSCAL WITH D) 500-200 MG-UNIT tablet, Take 1 tablet by mouth.   CHOLECALCIFEROL PO, Take 1,000 Units by mouth daily.   gabapentin  (NEURONTIN ) 100 MG capsule, Take 2 capsules (200 mg total) by mouth at bedtime.   gabapentin  (NEURONTIN ) 300 MG capsule, Take 300 mg by mouth at bedtime.   Multiple Vitamin (MULTIVITAMIN) tablet, Take 1 tablet by mouth daily.   triazolam (HALCION) 0.25 MG tablet, Take 0.25 mg by mouth at bedtime as needed for sleep. Take 1 tablet by  mouth 1 hour prior to dental appointment   Vitamin D , Ergocalciferol , (DRISDOL ) 1.25 MG (50000 UNIT) CAPS capsule, Take 1 capsule (50,000 Units total) by mouth every 7 (seven) days.   Reviewed prior external information including notes and imaging from  primary care provider As well as notes that were available from care everywhere and other healthcare systems.  Past medical history, social, surgical and family history all reviewed in electronic medical record.  No pertanent information unless stated regarding to the chief complaint.   Review of Systems:  No headache, visual changes, nausea, vomiting, diarrhea, constipation,  dizziness,  skin rash, fevers, chills, night sweats, weight loss, swollen lymph nodes, body aches, joint swelling, chest pain, shortness of breath, mood changes. POSITIVE muscle aches, abdominal pain intermittently and can be worsening for no good reason  Objective  Blood pressure 118/82, pulse 76, height 5\' 5"  (1.651 m), weight 197 lb (89.4 kg), SpO2 97%.   General: No apparent distress alert and oriented x3 mood and affect normal, dressed appropriately.  HEENT: Pupils equal, extraocular movements intact  Respiratory: Patient's speak in full sentences and does not appear short of breath  Cardiovascular: No lower extremity edema, non tender, no erythema  Abdominal exam deferred    Impression and Recommendations:    The above documentation has been reviewed and is accurate and complete Kimo Bancroft M Sheilla Maris, DO

## 2023-06-15 ENCOUNTER — Other Ambulatory Visit: Payer: Self-pay

## 2023-06-15 ENCOUNTER — Ambulatory Visit (INDEPENDENT_AMBULATORY_CARE_PROVIDER_SITE_OTHER): Admitting: Family Medicine

## 2023-06-15 VITALS — BP 118/82 | HR 76 | Ht 65.0 in | Wt 197.0 lb

## 2023-06-15 DIAGNOSIS — R109 Unspecified abdominal pain: Secondary | ICD-10-CM | POA: Diagnosis not present

## 2023-06-15 DIAGNOSIS — M25551 Pain in right hip: Secondary | ICD-10-CM | POA: Diagnosis not present

## 2023-06-15 NOTE — Assessment & Plan Note (Signed)
 Abdominal pain is out of proportion again.  Unfortunately very difficult to decide what is potentially contributing.  We did see some calcific changes previously around the scar tissue but now that this is now changed and been more in the lower quadrant than it has been.  Also having more diffuse pain than what patient was having previously.  Do feel at this point we should consider advanced imaging.  I think an MRI of the abdomen and pelvis is necessary at this time especially with that now going into the right lower quadrant and even into almost the pelvic area.  Patient has failed all other conservative therapy.  Discussed with patient about icing regimen of home exercises, discussed which activities to do and which ones to avoid.  Increase activity slowly.  Discussed icing regimen.  Follow-up with me again 6 to 8 weeks or after imaging and we will discuss further.

## 2023-06-15 NOTE — Patient Instructions (Signed)
Imaging [phone] Call Today  When we receive your results we will contact you.  

## 2023-06-27 ENCOUNTER — Inpatient Hospital Stay: Admission: RE | Admit: 2023-06-27 | Source: Ambulatory Visit

## 2023-06-27 ENCOUNTER — Other Ambulatory Visit

## 2023-08-16 ENCOUNTER — Other Ambulatory Visit: Payer: Self-pay | Admitting: Family Medicine

## 2023-08-16 DIAGNOSIS — G8929 Other chronic pain: Secondary | ICD-10-CM

## 2023-08-20 ENCOUNTER — Ambulatory Visit
Admission: RE | Admit: 2023-08-20 | Discharge: 2023-08-20 | Disposition: A | Discharge status: Home / Self Care | Source: Ambulatory Visit | Attending: Family Medicine | Admitting: Family Medicine

## 2023-08-20 DIAGNOSIS — R109 Unspecified abdominal pain: Secondary | ICD-10-CM | POA: Insufficient documentation

## 2023-08-20 DIAGNOSIS — G8929 Other chronic pain: Secondary | ICD-10-CM | POA: Insufficient documentation

## 2023-08-20 MED ORDER — GADOBUTROL 1 MMOL/ML IV SOLN
9.0000 mL | Freq: Once | INTRAVENOUS | Status: AC | PRN
  Administered 2023-08-20: 9 mL via INTRAVENOUS

## 2023-09-06 ENCOUNTER — Ambulatory Visit
Admission: EM | Admit: 2023-09-06 | Discharge: 2023-09-06 | Disposition: A | Discharge status: Home / Self Care | Attending: Family Medicine | Admitting: Family Medicine

## 2023-09-06 DIAGNOSIS — Z23 Encounter for immunization: Secondary | ICD-10-CM

## 2023-09-06 DIAGNOSIS — S61210A Laceration without foreign body of right index finger without damage to nail, initial encounter: Secondary | ICD-10-CM

## 2023-09-06 MED ORDER — DOXYCYCLINE HYCLATE 100 MG PO CAPS: 100.0000 mg | ORAL_CAPSULE | Freq: Two times a day (BID) | ORAL | 0 refills | Status: AC

## 2023-09-06 MED ORDER — TETANUS-DIPHTH-ACELL PERTUSSIS 5-2.5-18.5 LF-MCG/0.5 IM SUSY
0.5000 mL | PREFILLED_SYRINGE | Freq: Once | INTRAMUSCULAR | Status: AC
  Administered 2023-09-06: 0.5 mL via INTRAMUSCULAR

## 2023-09-06 NOTE — Discharge Instructions (Addendum)
Stop by the pharmacy to pick up your prescriptions.  Follow up with your primary care provider or return to urgent care to have sutures removed in 7-10 days.  Watch for signs and symptoms of infection like fever worsening redness or increasing pain. See handout for more information.   

## 2023-09-06 NOTE — ED Provider Notes (Signed)
 [facility]    CSN: [id number] Arrival date & time: 09/06/23  1748      History   Chief Complaint Chief Complaint  Patient presents with  . Laceration    HPI Rachel Pratt is a 58 y.o. female.   HPI  Rachel Pratt presents for right index finger laceration while opening a can today at home around 445 PM. She wrapped it in a paper towel and held applied pressure to wound prior to arrival.   She is unsure when her last tetanus shot was.  No other injuries or concerns.   Melea has otherwise been well and has no other concerns.      Past Medical History:  Diagnosis Date  . Depression   . Fibromyalgia   . Hypertension   . Neuropathy   . Trigeminal neuralgia     Patient Active Problem List   Diagnosis Date Noted  . Right sided abdominal pain 04/13/2023  . Essential hypertension 02/11/2022  . Pure hypercholesterolemia 02/11/2022  . Fibromyalgia 02/03/2021  . Obesity (BMI 30.0-34.9) 12/14/2016  . History of trigeminal neuralgia 12/07/2015    Past Surgical History:  Procedure Laterality Date  . CHOLECYSTECTOMY    . COLONOSCOPY WITH PROPOFOL  N/A 06/29/2020   Procedure: COLONOSCOPY WITH PROPOFOL ;  Surgeon: Maryruth Ole DASEN, MD;  Location: [facility] ENDOSCOPY;  Service: Endoscopy;  Laterality: N/A;  . GANGLION CYST EXCISION Right   . TUBAL LIGATION      OB History   No obstetric history on file.      Home Medications    Prior to Admission medications   Medication Sig Start Date End Date Taking? Authorizing Provider  estradiol (CLIMARA - DOSED IN MG/24 HR) 0.075 mg/24hr patch 0.075 mg once a week.   Yes [provider]  acyclovir (ZOVIRAX) 200 MG capsule Take 200 mg by mouth 2 (two) times daily as needed.    [provider]  Ascorbic Acid (VITAMIN C) 1000 MG tablet Take 1,000 mg by mouth daily.    [provider]  Azelaic Acid (FINACEA) 15 % FOAM Apply 1 application topically daily.    [provider]  calcium-vitamin  D (OSCAL WITH D) 500-200 MG-UNIT tablet Take 1 tablet by mouth.    [provider]  CHOLECALCIFEROL PO Take 1,000 Units by mouth daily.    [provider]  gabapentin  (NEURONTIN ) 100 MG capsule Take 2 capsules (200 mg total) by mouth at bedtime. 04/13/23   Claudene Arthea HERO, DO  gabapentin  (NEURONTIN ) 300 MG capsule Take 300 mg by mouth at bedtime.    [provider]  ibuprofen (ADVIL) 800 MG tablet Take 800 mg by mouth every 6 (six) hours as needed.    [provider]  Multiple Vitamin (MULTIVITAMIN) tablet Take 1 tablet by mouth daily.    [provider]  progesterone (PROMETRIUM) 200 MG capsule Take by mouth.    [provider]  TRI-PREVIFEM 0.18/0.215/0.25 MG-35 MCG tablet Take 1 tablet by mouth daily. 05/22/16   [provider]  triazolam (HALCION) 0.25 MG tablet Take 0.25 mg by mouth at bedtime as needed for sleep. Take 1 tablet by  mouth 1 hour prior to dental appointment    [provider]  Vitamin D , Ergocalciferol , (DRISDOL ) 1.25 MG (50000 UNIT) CAPS capsule Take 1 capsule (50,000 Units total) by mouth every 7 (seven) days. 04/13/23   Claudene Arthea HERO, DO    Family History Family History  Problem Relation Age of Onset  . Breast cancer  Maternal Aunt 31    Social History Social History   Tobacco Use  . Smoking status: Former    Current packs/day: 0.00    Types: Cigarettes    Start date: 01/17/1966    Quit date: 01/17/1993    Years since quitting: 30.6  . Smokeless tobacco: Never  Vaping Use  . Vaping status: Never Used  Substance Use Topics  . Alcohol use: Not Currently  . Drug use: No     Allergies   Mango flavoring agent (non-screening), Codeine sulfate, Percocet [oxycodone-acetaminophen], and Qsymia [phentermine-topiramate er]   Review of Systems Review of Systems :negative unless otherwise stated in HPI.      Physical Exam Triage Vital Signs ED Triage Vitals  Encounter Vitals Group     BP  09/06/23 1758 123/81     Girls Systolic BP Percentile --      Girls Diastolic BP Percentile --      Boys Systolic BP Percentile --      Boys Diastolic BP Percentile --      Pulse Rate 09/06/23 1758 81     Resp 09/06/23 1758 17     Temp 09/06/23 1758 98.5 F (36.9 C)     Temp Source 09/06/23 1758 Oral     SpO2 09/06/23 1758 96 %     Weight 09/06/23 1757 193 lb (87.5 kg)     Height --      Head Circumference --      Peak Flow --      Pain Score 09/06/23 1757 2     Pain Loc --      Pain Education --      Exclude from Growth Chart --    No data found.  Updated Vital Signs BP 123/81 (BP Location: Left Arm)   Pulse 81   Temp 98.5 F (36.9 C) (Oral)   Resp 17   Wt 87.5 kg   LMP 09/04/2020   SpO2 96%   BMI 32.12 kg/m   Visual Acuity Right Eye Distance:   Left Eye Distance:   Bilateral Distance:    Right Eye Near:   Left Eye Near:    Bilateral Near:     Physical Exam  GEN: alert, well appearing female, in no acute distress *** EYES: no scleral injection or discharge*** CV: regular rate and rhythm *** RESP: no increased work of breathing, clear to ascultation bilaterally*** MSK: no extremity edema *** NEURO: alert, moves all extremities appropriately SKIN: warm and dry; ***2.5 cm angulated laceration between PIP and DIP joint    ***pic  UC Treatments / Results  Labs (all labs ordered are listed, but only abnormal results are displayed) Labs Reviewed - No data to display  EKG   Radiology No results found.  Procedures Procedures (including critical care time)  Medications Ordered in UC Medications  Tdap (BOOSTRIX) injection 0.5 mL (0.5 mLs Intramuscular Given 09/06/23 1805)    Initial Impression / Assessment and Plan / UC Course  I have reviewed the triage vital signs and the nursing notes.  Pertinent labs & imaging results that were available during my care of the patient were reviewed by me and considered in my medical decision making (see chart for  details).      Pt is a 58 y.o. female who presents after injuring *** location at home*** just prior to arrival. ***Angulated laceration was repaired patient tolerated procedure well.  Pt received *** sutures.  Home care instructions provided. OTC analgesics as needed for pain.  Tetanus updated prior to discharge. Pt's last tentaus was ***.    *** document procedure    Final Clinical Impressions(s) / UC Diagnoses   Final diagnoses:  None   Discharge Instructions   None    ED Prescriptions   None    PDMP not reviewed this encounter.

## 2023-09-06 NOTE — ED Triage Notes (Signed)
 Patient cut her right pointing finger on a can today. Unsure of last t-dap. Bleeding has stopped.

## 2023-09-08 DIAGNOSIS — S61210A Laceration without foreign body of right index finger without damage to nail, initial encounter: Secondary | ICD-10-CM

## 2023-09-19 ENCOUNTER — Inpatient Hospital Stay: Admission: RE | Admit: 2023-09-19 | Source: Ambulatory Visit

## 2023-09-19 ENCOUNTER — Ambulatory Visit
Admission: EM | Admit: 2023-09-19 | Discharge: 2023-09-19 | Disposition: A | Discharge status: Home / Self Care | Attending: Emergency Medicine | Admitting: Emergency Medicine

## 2023-09-19 DIAGNOSIS — G500 Trigeminal neuralgia: Secondary | ICD-10-CM

## 2023-09-19 DIAGNOSIS — H9201 Otalgia, right ear: Secondary | ICD-10-CM | POA: Diagnosis not present

## 2023-09-19 MED ORDER — ACYCLOVIR 200 MG PO CAPS: 200.0000 mg | ORAL_CAPSULE | Freq: Every day | ORAL | 0 refills | Status: AC

## 2023-09-19 NOTE — Discharge Instructions (Addendum)
 Take your gabapentin  as prescribed. Take zovirax  as directed. Follow up with PCP. If your HA worsens or becomes worst HA of life-go to ER for further evaluation

## 2023-09-19 NOTE — ED Provider Notes (Signed)
 [facility]    CSN: [id number] Arrival date & time: 09/19/23  1423      History   Chief Complaint Chief Complaint  Patient presents with   Otalgia   Suture / Staple Removal    HPI Rachel Pratt is a 58 y.o. female.   58 year old female, Rachel Pratt, here for suture removal (suture repaired 13 days prior), pt also c/o right ear pain 7/10 x 10 days. Pt was scripted doxycycline  at discharge for laceration-right index finger healed well, sutures removed prior to eval by staff.   Pt states this right ear pain feels like her trigeminal neuralgia acting up, acyclovir  and gabapentin  work best for her,pt states she has gabapentin  requesting acyclovir  script.   PMH: Depression,fibromalgia, HTN, neuropathyy, trigeminal  The history is provided by the patient. No language interpreter was used.    Past Medical History:  Diagnosis Date   Depression    Fibromyalgia    Hypertension    Neuropathy    Trigeminal neuralgia     Patient Active Problem List   Diagnosis Date Noted   Trigeminal neuralgia of right side of face 09/19/2023   Right sided abdominal pain 04/13/2023   Essential hypertension 02/11/2022   Pure hypercholesterolemia 02/11/2022   Fibromyalgia 02/03/2021   Obesity (BMI 30.0-34.9) 12/14/2016   History of trigeminal neuralgia 12/07/2015    Past Surgical History:  Procedure Laterality Date   CHOLECYSTECTOMY     COLONOSCOPY WITH PROPOFOL  N/A 06/29/2020   Procedure: COLONOSCOPY WITH PROPOFOL ;  Surgeon: Maryruth Ole DASEN, MD;  Location: [facility] ENDOSCOPY;  Service: Endoscopy;  Laterality: N/A;   GANGLION CYST EXCISION Right    TUBAL LIGATION      OB History   No obstetric history on file.      Home Medications    Prior to Admission medications   Medication Sig Start Date End Date Taking? Authorizing Provider  estradiol (CLIMARA - DOSED IN MG/24 HR) 0.075 mg/24hr patch 0.075 mg once a week.   Yes [provider]  acyclovir  (ZOVIRAX )  200 MG capsule Take 1 capsule (200 mg total) by mouth 5 (five) times daily for 5 days. 09/19/23 09/24/23  Maralee Higuchi, Rilla, NP  Ascorbic Acid (VITAMIN C) 1000 MG tablet Take 1,000 mg by mouth daily.    [provider]  Azelaic Acid (FINACEA) 15 % FOAM Apply 1 application topically daily.    [provider]  calcium-vitamin D  (OSCAL WITH D) 500-200 MG-UNIT tablet Take 1 tablet by mouth.    [provider]  CHOLECALCIFEROL PO Take 1,000 Units by mouth daily.    [provider]  doxycycline  (VIBRAMYCIN ) 100 MG capsule Take 1 capsule (100 mg total) by mouth 2 (two) times daily. 09/06/23   Brimage, Vondra, DO  gabapentin  (NEURONTIN ) 100 MG capsule Take 2 capsules (200 mg total) by mouth at bedtime. 04/13/23   Smith, Zachary M, DO  gabapentin  (NEURONTIN ) 300 MG capsule Take 300 mg by mouth at bedtime.    [provider]  ibuprofen (ADVIL) 800 MG tablet Take 800 mg by mouth every 6 (six) hours as needed.    [provider]  Multiple Vitamin (MULTIVITAMIN) tablet Take 1 tablet by mouth daily.    [provider]  progesterone (PROMETRIUM) 200 MG capsule Take by mouth.    [provider]  TRI-PREVIFEM 0.18/0.215/0.25 MG-35 MCG tablet Take 1 tablet by mouth daily. 05/22/16   [provider]  triazolam (HALCION) 0.25 MG tablet Take 0.25 mg by mouth  at bedtime as needed for sleep. Take 1 tablet by  mouth 1 hour prior to dental appointment    [provider]  Vitamin D , Ergocalciferol , (DRISDOL ) 1.25 MG (50000 UNIT) CAPS capsule Take 1 capsule (50,000 Units total) by mouth every 7 (seven) days. 04/13/23   Claudene Arthea HERO, DO    Family History Family History  Problem Relation Age of Onset   Breast cancer Maternal Aunt 73    Social History Social History   Tobacco Use   Smoking status: Former    Current packs/day: 0.00    Types: Cigarettes    Start date: 01/17/1966    Quit date: 01/17/1993    Years since quitting: 30.6    Smokeless tobacco: Never  Vaping Use   Vaping status: Never Used  Substance Use Topics   Alcohol use: Not Currently   Drug use: No     Allergies   Mangifera indica, Mango flavoring agent (non-screening), Codeine sulfate, Percocet [oxycodone-acetaminophen], and Qsymia [phentermine-topiramate er]   Review of Systems Review of Systems  Constitutional:  Negative for fever.  HENT:  Positive for ear pain. Negative for ear discharge and facial swelling.   Neurological:  Positive for headaches.  All other systems reviewed and are negative.    Physical Exam Triage Vital Signs ED Triage Vitals  Encounter Vitals Group     BP      Girls Systolic BP Percentile      Girls Diastolic BP Percentile      Boys Systolic BP Percentile      Boys Diastolic BP Percentile      Pulse      Resp      Temp      Temp src      SpO2      Weight      Height      Head Circumference      Peak Flow      Pain Score      Pain Loc      Pain Education      Exclude from Growth Chart    No data found.  Updated Vital Signs BP 133/82 (BP Location: Right Arm)   Pulse 70   Temp 98.1 F (36.7 C) (Oral)   Resp 18   LMP 09/04/2020   SpO2 97%   Visual Acuity Right Eye Distance:   Left Eye Distance:   Bilateral Distance:    Right Eye Near:   Left Eye Near:    Bilateral Near:     Physical Exam Vitals and nursing note reviewed.  Constitutional:      General: She is not in acute distress.    Appearance: She is well-developed and well-groomed.  HENT:     Head: Normocephalic and atraumatic.     Right Ear: Tympanic membrane is retracted.     Left Ear: Tympanic membrane is retracted.     Mouth/Throat:     Lips: Pink.     Mouth: Mucous membranes are moist.     Pharynx: Oropharynx is clear. Uvula midline.  Eyes:     Conjunctiva/sclera: Conjunctivae normal.  Cardiovascular:     Rate and Rhythm: Normal rate.     Heart sounds: No murmur heard. Pulmonary:     Effort: Pulmonary effort is normal.  No respiratory distress.  Abdominal:     Palpations: Abdomen is soft.     Tenderness: There is no abdominal tenderness.  Musculoskeletal:        General: No swelling.  Cervical back: Neck supple.  Skin:    General: Skin is warm and dry.     Capillary Refill: Capillary refill takes less than 2 seconds.  Neurological:     General: No focal deficit present.     Mental Status: She is alert and oriented to person, place, and time.     GCS: GCS eye subscore is 4. GCS verbal subscore is 5. GCS motor subscore is 6.  Psychiatric:        Attention and Perception: Attention normal.        Mood and Affect: Mood normal.        Speech: Speech normal.        Behavior: Behavior is cooperative.      UC Treatments / Results  Labs (all labs ordered are listed, but only abnormal results are displayed) Labs Reviewed - No data to display  EKG   Radiology No results found.  Procedures Procedures (including critical care time)  Medications Ordered in UC Medications - No data to display  Initial Impression / Assessment and Plan / UC Course  I have reviewed the triage vital signs and the nursing notes.  Pertinent labs & imaging results that were available during my care of the patient were reviewed by me and considered in my medical decision making (see chart for details).    Discussed exam findings and plan of care with patient, scripted acyclovir , strict go to ER precautions given.   Patient verbalized understanding to this provider.  Ddx: Trigeminal neuralgia, right otalgia, viral illness, suture removal Final Clinical Impressions(s) / UC Diagnoses   Final diagnoses:  Trigeminal neuralgia of right side of face  Otalgia of right ear     Discharge Instructions      Take your gabapentin  as prescribed. Take zovirax  as directed. Follow up with PCP. If your HA worsens or becomes worst HA of life-go to ER for further evaluation     ED Prescriptions     Medication Sig Dispense  Auth. Provider   acyclovir  (ZOVIRAX ) 200 MG capsule Take 1 capsule (200 mg total) by mouth 5 (five) times daily for 5 days. 25 capsule Teia Freitas, Rilla, NP      PDMP not reviewed this encounter.   Aminta Rilla, NP 09/19/23 1513

## 2023-09-19 NOTE — ED Triage Notes (Signed)
 Patient is here for suture removal and right ear pain x 10 days. Sutures removed. Area well healed.

## 2023-09-20 ENCOUNTER — Ambulatory Visit

## 2024-02-20 ENCOUNTER — Telehealth: Admitting: Nurse Practitioner

## 2024-02-20 DIAGNOSIS — R110 Nausea: Secondary | ICD-10-CM

## 2024-02-20 MED ORDER — ONDANSETRON HCL 4 MG PO TABS: 4.0000 mg | ORAL_TABLET | Freq: Three times a day (TID) | ORAL | 0 refills | Status: AC | PRN

## 2024-02-20 NOTE — Progress Notes (Signed)
